# Patient Record
Sex: Female | Born: 1975 | Race: White | Hispanic: No | Marital: Married | Smoking: Never smoker
Health system: Southern US, Community
[De-identification: ages and names within clinical notes are randomized; demographics above are authoritative.]

## PROBLEM LIST (undated history)

## (undated) DIAGNOSIS — J302 Other seasonal allergic rhinitis: Secondary | ICD-10-CM

## (undated) DIAGNOSIS — N3010 Interstitial cystitis (chronic) without hematuria: Secondary | ICD-10-CM

---

## 2014-04-27 ENCOUNTER — Emergency Department
Admission: EM | Admit: 2014-04-27 | Discharge: 2014-04-27 | Disposition: A | Discharge status: Home / Self Care | Payer: BLUE CROSS/BLUE SHIELD | Source: Home / Self Care | Attending: Family Medicine | Admitting: Family Medicine

## 2014-04-27 ENCOUNTER — Encounter: Payer: Self-pay | Admitting: *Deleted

## 2014-04-27 DIAGNOSIS — J310 Chronic rhinitis: Secondary | ICD-10-CM

## 2014-04-27 HISTORY — DX: Interstitial cystitis (chronic) without hematuria: N30.10

## 2014-04-27 MED ORDER — PREDNISONE 20 MG PO TABS: 20.0000 mg | ORAL_TABLET | Freq: Two times a day (BID) | ORAL | Status: DC

## 2014-04-27 NOTE — Discharge Instructions (Signed)
If cold like symptoms develop, begin the following:  Take plain guaifenesin 1280m (Mucinex) twice daily, with plenty of water, for cough and congestion.  Get adequate rest.   May use Afrin nasal spray (or generic oxymetazoline) twice daily for about 5 days.  Also recommend using saline nasal spray several times daily and saline nasal irrigation (AYR is a common brand) Try warm salt water gargles for sore throat.  Stop all antihistamines for now, and other non-prescription cough/cold preparations. May take Delsym Cough Suppressant at bedtime for nighttime cough.   Follow-up with family doctor if not improving about10 days.

## 2014-04-27 NOTE — ED Notes (Signed)
Pt c/o muscle aches and congestion in her throat x 2 wks. Denies cough or fever.

## 2014-04-27 NOTE — ED Provider Notes (Signed)
CSN: [id number]     Arrival date & time 04/27/14  1944 History   First MD Initiated Contact with Patient 04/27/14 2000     Chief Complaint  Patient presents with  . Generalized Body Aches  . Nasal Congestion      HPI Comments: About three weeks ago patient developed typical cold-like symptoms including mild sore throat, sinus congestion, headache, and fatigue but no cough.  Her symptoms improved but she continues to have mild sinus congestion but no facial pain.  She has felt worse over the past few days with myalgias and increased sinus congestion. She notes that she has a history of seasonal rhinitis.   The history is provided by the patient.    Past Medical History  Diagnosis Date  . Interstitial cystitis    History reviewed. No pertinent past surgical history. Family History  Problem Relation Age of Onset  . Heart attack Mother   . Heart failure Father    History  Substance Use Topics  . Smoking status: Never Smoker   . Smokeless tobacco: Not on file  . Alcohol Use: No   OB History    No data available     Review of Systems + sore throat, resolved + hoarseness No cough No pleuritic pain No wheezing + nasal congestion + post-nasal drainage No sinus pain/pressure No itchy/red eyes ? earache No hemoptysis No SOB No fever/chills No nausea No vomiting No abdominal pain No diarrhea No urinary symptoms No skin rash + fatigue + myalgias + headache Used OTC meds without relief  Allergies  Augmentin  Home Medications   Prior to Admission medications   Medication Sig Start Date End Date Taking? Authorizing Provider  predniSONE (DELTASONE) 20 MG tablet Take 1 tablet (20 mg total) by mouth 2 (two) times daily. Take with food. 04/27/14   Kandra Nicolas, MD   BP 128/79 mmHg  Pulse 77  Temp(Src) 98.8 F (37.1 C)  Resp 16  Ht 5' 2"  (1.575 m)  Wt 151 lb (68.493 kg)  BMI 27.61 kg/m2  SpO2 100%  LMP 03/29/2014 Physical Exam Nursing notes and Vital Signs  reviewed. Appearance:  Patient appears stated age, and in no acute distress Eyes:  Pupils are equal, round, and reactive to light and accomodation.  Extraocular movement is intact.  Conjunctivae are not inflamed  Ears:  Canals normal.  Tympanic membranes normal.  Nose:  Congested turbinates.  No sinus tenderness.   Pharynx:  Normal Neck:  Supple.  Enlarged mildly tender posterior nodes are palpated bilaterally  Lungs:  Clear to auscultation.  Breath sounds are equal.  Chest:  Distinct tenderness to palpation over the mid-sternum.  Heart:  Regular rate and rhythm without murmurs, rubs, or gallops.  Abdomen:  Nontender without masses or hepatosplenomegaly.  Bowel sounds are present.  No CVA or flank tenderness.  Extremities:  No edema.  No calf tenderness Skin:  No rash present.   ED Course  Procedures none   MDM   1. Rhinitis, chronic; ?early viral URI    There is no evidence of bacterial infection today.   Begin prednisone burst. If cold like symptoms develop, begin the following:  Take plain guaifenesin 1245m (Mucinex) twice daily, with plenty of water, for cough and congestion.  Get adequate rest.   May use Afrin nasal spray (or generic oxymetazoline) twice daily for about 5 days.  Also recommend using saline nasal spray several times daily and saline nasal irrigation (AYR is a common brand) Try warm  salt water gargles for sore throat.  Stop all antihistamines for now, and other non-prescription cough/cold preparations. May take Delsym Cough Suppressant at bedtime for nighttime cough.   Follow-up with family doctor if not improving about10 days.    Kandra Nicolas, MD 04/29/14 [phone]

## 2014-04-30 ENCOUNTER — Telehealth: Payer: Self-pay | Admitting: Emergency Medicine

## 2014-12-15 ENCOUNTER — Emergency Department
Admission: EM | Admit: 2014-12-15 | Discharge: 2014-12-15 | Disposition: A | Discharge status: Home / Self Care | Payer: BLUE CROSS/BLUE SHIELD | Source: Home / Self Care | Attending: Family Medicine | Admitting: Family Medicine

## 2014-12-15 ENCOUNTER — Encounter: Payer: Self-pay | Admitting: *Deleted

## 2014-12-15 DIAGNOSIS — J069 Acute upper respiratory infection, unspecified: Secondary | ICD-10-CM

## 2014-12-15 DIAGNOSIS — R0981 Nasal congestion: Secondary | ICD-10-CM | POA: Diagnosis not present

## 2014-12-15 DIAGNOSIS — M542 Cervicalgia: Secondary | ICD-10-CM

## 2014-12-15 MED ORDER — AZITHROMYCIN 250 MG PO TABS: 250.0000 mg | ORAL_TABLET | Freq: Every day | ORAL | Status: DC

## 2014-12-15 MED ORDER — FLUTICASONE PROPIONATE 50 MCG/ACT NA SUSP: NASAL | Status: DC

## 2014-12-15 NOTE — ED Provider Notes (Signed)
CSN: [id number]     Arrival date & time 12/15/14  0807 History   First MD Initiated Contact with Patient 12/15/14 0813     Chief Complaint  Patient presents with  . Neck Pain  . Ear Fullness   (Consider location/radiation/quality/duration/timing/severity/associated sxs/prior Treatment) HPI  Pt is a 39yo female presenting to [facility] with c/o intermittent bilateral ear "fullness" nasal congestion, dizziness, headaches and chest tightness. Pt states she had a "cold" about 1 month ago but felt it was her "allergies" so she started taking Allegra. Her symptoms did improve but never completely went away.  States her children were recently dx with sinus infections and pt is concerned she may have one now.  Pt also c/o neck pain that woke her from her sleep around 4AM this morning. Pt states she believes she slept wrong and that's what caused the neck pain but isn't certain. Denies fever, chills, n/v/d. Denies CP or SOB at this time but states her chest does feel tight on occasions. Denies hx of asthma.  Past Medical History  Diagnosis Date  . Interstitial cystitis    History reviewed. No pertinent past surgical history. Family History  Problem Relation Age of Onset  . Heart attack Mother   . Heart disease Mother   . Heart failure Father   . Heart disease Father    Social History  Substance Use Topics  . Smoking status: Never Smoker   . Smokeless tobacco: None  . Alcohol Use: No   OB History    No data available     Review of Systems  Constitutional: Negative for fever and chills.  HENT: Positive for congestion, ear pain, sinus pressure, sneezing and sore throat. Negative for trouble swallowing and voice change.   Respiratory: Positive for cough and chest tightness. Negative for shortness of breath.   Gastrointestinal: Negative for nausea, vomiting, abdominal pain and diarrhea.  Musculoskeletal: Positive for myalgias, arthralgias and neck pain. Negative for neck stiffness.       Body aches   Neurological: Positive for dizziness and headaches. Negative for light-headedness.    Allergies  Augmentin  Home Medications   Prior to Admission medications   Medication Sig Start Date End Date Taking? Authorizing Provider  fexofenadine (ALLEGRA) 180 MG tablet Take 180 mg by mouth daily.   Yes Historical Provider, MD  azithromycin (ZITHROMAX) 250 MG tablet Take 1 tablet (250 mg total) by mouth daily. Take first 2 tablets together, then 1 every day until finished. 12/15/14   Noland Fordyce, PA-C  fluticasone (FLONASE) 50 MCG/ACT nasal spray 2 sprays per nostril daily first week, 1 spray per nostril daily 12/15/14   Noland Fordyce, PA-C  predniSONE (DELTASONE) 20 MG tablet Take 1 tablet (20 mg total) by mouth 2 (two) times daily. Take with food. 04/27/14   Kandra Nicolas, MD   Meds Ordered and Administered this Visit  Medications - No data to display  BP 125/85 mmHg  Pulse 71  Temp(Src) 98.6 F (37 C) (Oral)  Resp 16  Ht 5' 2"  (1.575 m)  Wt 154 lb (69.854 kg)  BMI 28.16 kg/m2  SpO2 99%  LMP 12/01/2014 No data found.   Physical Exam  Constitutional: She appears well-developed and well-nourished. No distress.  HENT:  Head: Normocephalic and atraumatic.  Right Ear: Hearing, tympanic membrane, external ear and ear canal normal.  Left Ear: Hearing, tympanic membrane, external ear and ear canal normal.  Nose: Mucosal edema present. Right sinus exhibits no maxillary sinus tenderness and no  frontal sinus tenderness. Left sinus exhibits no maxillary sinus tenderness and no frontal sinus tenderness.  Mouth/Throat: Uvula is midline and mucous membranes are normal. Posterior oropharyngeal erythema present. No oropharyngeal exudate, posterior oropharyngeal edema or tonsillar abscesses.  Eyes: Conjunctivae are normal. No scleral icterus.  Neck: Normal range of motion. Neck supple.    Tenderness at base of neck over C7 and surrounding muscles. FROM w/o pain.  Cardiovascular: Normal rate,  regular rhythm and normal heart sounds.   Pulmonary/Chest: Effort normal and breath sounds normal. No respiratory distress. She has no wheezes. She has no rales. She exhibits no tenderness.  Abdominal: Soft. She exhibits no distension. There is no tenderness.  Musculoskeletal: Normal range of motion. She exhibits tenderness. She exhibits no edema.  Tenderness to upper trapezius. FROM upper extremities with 5/5 strength bilaterally  Neurological: She is alert.  Skin: Skin is warm and dry. She is not diaphoretic.  Nursing note and vitals reviewed.   ED Course  Procedures (including critical care time)  Labs Review Labs Reviewed - No data to display  Imaging Review No results found.    MDM   1. Acute upper respiratory infection   2. Sinus congestion   3. Neck pain     URI symptoms intermittently for 4 weeks, worsening with ear fullness last 1 week. Sick contacts. Rx: azithromycin, flonase Advised pt to use acetaminophen and ibuprofen as needed for fever and pain. Encouraged rest and fluids. F/u with PCP in 10 days if not improving, sooner if worsening. Pt verbalized understanding and agreement with tx plan.    Noland Fordyce, PA-C 12/15/14 [phone]

## 2014-12-15 NOTE — ED Notes (Signed)
Pt c/o neck ache and bilateral ear stuffiness x 1 wk. Denies fever.

## 2014-12-15 NOTE — Discharge Instructions (Signed)
You may take 400-646m Ibuprofen (Motrin) every 6-8 hours for fever and pain  Alternate with Tylenol  You may take 5079mTylenol every 4-6 hours as needed for fever and pain  Follow-up with your primary care provider next week for recheck of symptoms if not improving.  Be sure to drink plenty of fluids and rest, at least 8hrs of sleep a night, preferably more while you are sick. Return urgent care or go to closest ER if you cannot keep down fluids/signs of dehydration, fever not reducing with Tylenol, difficulty breathing/wheezing, stiff neck, worsening condition, or other concerns (see below)

## 2014-12-31 ENCOUNTER — Encounter: Payer: Self-pay | Admitting: Emergency Medicine

## 2014-12-31 ENCOUNTER — Emergency Department
Admission: EM | Admit: 2014-12-31 | Discharge: 2014-12-31 | Disposition: A | Discharge status: Home / Self Care | Payer: BLUE CROSS/BLUE SHIELD | Source: Home / Self Care | Attending: Family Medicine | Admitting: Family Medicine

## 2014-12-31 DIAGNOSIS — R253 Fasciculation: Secondary | ICD-10-CM

## 2014-12-31 NOTE — ED Notes (Signed)
Face around Right eye started twitching and feeling numb 4 days ago, then moved to Left side, twitching and slight numbness. Patient states her mother died in her sleep and she has lots of anxiety and panic attacks.

## 2014-12-31 NOTE — Discharge Instructions (Signed)
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 In this condition, the eyes seem to be winking or even seem to be forced shut. There may be twitching of the eyelids that lasts for a long period of time.  BEB tends to occur while you are awake. The disorder may get worse, to the point that the eyes are closed for long periods of time. Although this interferes with vision, it is not a form of blindness, because the eyes themselves are not abnormal and are capable of seeing. You are simply unable to see through the closed lids. BEB seems to occur most commonly in middle aged and elderly women. BEB is different from occasional twitching of the eyelids, which happens to many people. Occasional twitching is called "benign fasciculation," and it has no lasting effect. It goes away within a few hours or days.  SYMPTOMS  BEB usually starts slowly. At first, you may:  Be blinking a lot.  Have a feeling of irritation in your eyes.  Notice your eyelids twitching a lot.  Be squinting more than usual; especially in bright light. As time goes on, you may have difficulty keeping your eyes open. In general, symptoms occur while you are awake and go away while you sleep. As time goes on, all of the symptoms seem to get worse and more intense. TREATMENT  Most commonly, BEB is treated with injections of botulinum toxin (Botox). Botox relaxes the muscles, stopping the twitching. Treatment may cause drooping eyelids, blurring of vision, dry eyes, or double vision, but these symptoms are usually temporary. Document Released: 03/17/2002 Document Revised: 06/19/2011 Document Reviewed: 02/04/2009 [employer] Patient Information 2015 [place], [place]. This information is not intended to replace advice given to you by your health care provider. Make sure you discuss any questions you have  with your health care provider.

## 2014-12-31 NOTE — ED Provider Notes (Signed)
CSN: [id number]     Arrival date & time 12/31/14  1710 History   First MD Initiated Contact with Patient 12/31/14 1740     Chief Complaint  Patient presents with  . Eye Problem      HPI Comments: Patient reports that she has had intermittent "twitching" in her right eye, associated with vague sense of numbness for 4 days.  The symptoms resolved in her right eye, and then occurred in her left eye.  No changes in vision.  No other neurologic symptoms.  She feels well otherwise.  The history is provided by the patient.    Past Medical History  Diagnosis Date  . Interstitial cystitis    History reviewed. No pertinent past surgical history. Family History  Problem Relation Age of Onset  . Heart attack Mother   . Heart disease Mother   . Heart failure Father   . Heart disease Father    Social History  Substance Use Topics  . Smoking status: Never Smoker   . Smokeless tobacco: None  . Alcohol Use: No   OB History    No data available     Review of Systems  Constitutional: Negative for fever, chills, diaphoresis, activity change, fatigue and unexpected weight change.  HENT: Positive for sore throat. Negative for congestion, ear pain, facial swelling, rhinorrhea and trouble swallowing.   Eyes: Negative for photophobia, pain, discharge, redness, itching and visual disturbance.  Respiratory: Negative.   Cardiovascular: Negative.   Gastrointestinal: Negative.   Endocrine: Negative.   Genitourinary: Negative.   Musculoskeletal: Negative.   Skin: Negative.   Neurological: Positive for numbness. Negative for dizziness, tremors, facial asymmetry, speech difficulty, weakness, light-headedness and headaches.  All other systems reviewed and are negative.   Allergies  Augmentin  Home Medications   Prior to Admission medications   Medication Sig Start Date End Date Taking? Authorizing Provider  azithromycin (ZITHROMAX) 250 MG tablet Take 1 tablet (250 mg total) by mouth daily. Take  first 2 tablets together, then 1 every day until finished. 12/15/14   Noland Fordyce, PA-C  fexofenadine (ALLEGRA) 180 MG tablet Take 180 mg by mouth daily.    Historical Provider, MD  fluticasone (FLONASE) 50 MCG/ACT nasal spray 2 sprays per nostril daily first week, 1 spray per nostril daily 12/15/14   Noland Fordyce, PA-C  predniSONE (DELTASONE) 20 MG tablet Take 1 tablet (20 mg total) by mouth 2 (two) times daily. Take with food. 04/27/14   Kandra Nicolas, MD   Meds Ordered and Administered this Visit  Medications - No data to display  BP 135/86 mmHg  Pulse 71  Temp(Src) 98.8 F (37.1 C) (Oral)  Ht 5' 2"  (1.575 m)  Wt 155 lb (70.308 kg)  BMI 28.34 kg/m2  SpO2 100%  LMP 12/01/2014 No data found.   Physical Exam Nursing notes and Vital Signs reviewed. Appearance:  Patient appears stated age, and in no acute distress Eyes:  Pupils are equal, round, and reactive to light and accomodation.  Extraocular movement is intact.  Conjunctivae are not inflamed.  Fundi benign.  No photophobia.  No eyelid tenderness or swelling.  No tics noted.  Ears:  Canals normal.  Tympanic membranes normal.  Nose:   Normal turbinates.  No sinus tenderness.   Pharynx:  Normal Neck:  Supple.  No adenopathy or thyromegaly Lungs:  Clear to auscultation.  Breath sounds are equal.  Moving air well. Heart:  Regular rate and rhythm without murmurs, rubs, or gallops.  Abdomen:  Nontender without masses or hepatosplenomegaly.  Bowel sounds are present.  No CVA or flank tenderness.  Extremities:  No edema.    Skin:  No rash present.   Neurologic:  Cranial nerves 2 through 12 are normal.  Patellar and elbow reflexes are normal.  Cerebellar function is intact (finger-to-nose and rapid alternating hand movement).  Gait and station are normal.     ED Course  Procedures  None   Visual Acuity Review  Right Eye Distance: 20/25 Left Eye Distance: 20/25 Bilateral Distance: 20/25 (with correction)    MDM   1. Benign  fasciculations eyelids; note normal exam   Reassurance.  Followup with ophthalmologist if symptoms do not resolve spontaneously.      Kandra Nicolas, MD 01/04/15 1021

## 2015-01-21 ENCOUNTER — Encounter: Payer: Self-pay | Admitting: Emergency Medicine

## 2015-01-21 ENCOUNTER — Emergency Department
Admission: EM | Admit: 2015-01-21 | Discharge: 2015-01-21 | Disposition: A | Discharge status: Home / Self Care | Payer: BLUE CROSS/BLUE SHIELD | Source: Home / Self Care | Attending: Family Medicine | Admitting: Family Medicine

## 2015-01-21 DIAGNOSIS — H9201 Otalgia, right ear: Secondary | ICD-10-CM

## 2015-01-21 NOTE — Discharge Instructions (Signed)
You may start using your Allegra, Flonase, and try sinus rinses to see if this helps alleviate pain and pressure in your Right ear.  Please follow up with your primary care provider for recheck of symptoms in 1 week if not improving, sooner if worsening.

## 2015-01-21 NOTE — ED Notes (Signed)
Right earache started yesterday

## 2015-01-21 NOTE — ED Provider Notes (Signed)
CSN: [id number]     Arrival date & time 01/21/15  0807 History   First MD Initiated Contact with Patient 01/21/15 0818     Chief Complaint  Patient presents with  . Otalgia   (Consider location/radiation/quality/duration/timing/severity/associated sxs/prior Treatment) HPI  Pt is a 39yo female presenting to [facility] with c/o gradually worsening Right ear pain that is aching and throbbing.  Symptoms started yesterday.  Pain is 8/10 at worst. She denies change in hearing but states it does feel "full."  She recently saw an allergist who recommended she start [place] but has been contemplating the allergy shots so she has not started her allergy medication yet. She reports mild nasal congestion but denies cough, sore throat, fever, chills, n/v/d. Denies sick contacts or recent travel.   Past Medical History  Diagnosis Date  . Interstitial cystitis    History reviewed. No pertinent past surgical history. Family History  Problem Relation Age of Onset  . Heart attack Mother   . Heart disease Mother   . Heart failure Father   . Heart disease Father    Social History  Substance Use Topics  . Smoking status: Never Smoker   . Smokeless tobacco: None  . Alcohol Use: No   OB History    No data available     Review of Systems  Constitutional: Negative for fever and chills.  HENT: Positive for congestion and ear pain ( Right). Negative for ear discharge, hearing loss, sinus pressure and sore throat.   Respiratory: Negative for cough and shortness of breath.   Gastrointestinal: Negative for nausea, vomiting, abdominal pain and diarrhea.    Allergies  Augmentin  Home Medications   Prior to Admission medications   Medication Sig Start Date End Date Taking? Authorizing Provider  azithromycin (ZITHROMAX) 250 MG tablet Take 1 tablet (250 mg total) by mouth daily. Take first 2 tablets together, then 1 every day until finished. 12/15/14   Noland Fordyce, PA-C  fexofenadine (ALLEGRA) 180  MG tablet Take 180 mg by mouth daily.    Historical Provider, MD  fluticasone (FLONASE) 50 MCG/ACT nasal spray 2 sprays per nostril daily first week, 1 spray per nostril daily 12/15/14   Noland Fordyce, PA-C  predniSONE (DELTASONE) 20 MG tablet Take 1 tablet (20 mg total) by mouth 2 (two) times daily. Take with food. 04/27/14   Kandra Nicolas, MD   Meds Ordered and Administered this Visit  Medications - No data to display  BP 125/85 mmHg  Pulse 74  Temp(Src) 98.6 F (37 C) (Oral)  Ht 5' 2"  (1.575 m)  Wt 154 lb (69.854 kg)  BMI 28.16 kg/m2  SpO2 97% No data found.   Physical Exam  Constitutional: She appears well-developed and well-nourished. No distress.  HENT:  Head: Normocephalic and atraumatic.  Right Ear: Hearing, external ear and ear canal normal. Tympanic membrane is scarred.  Left Ear: Hearing, tympanic membrane, external ear and ear canal normal.  Nose: Nose normal.  Mouth/Throat: Uvula is midline, oropharynx is clear and moist and mucous membranes are normal.  Eyes: Conjunctivae are normal. No scleral icterus.  Neck: Normal range of motion. Neck supple.  Cardiovascular: Normal rate, regular rhythm and normal heart sounds.   Pulmonary/Chest: Effort normal and breath sounds normal. No respiratory distress. She has no wheezes. She has no rales. She exhibits no tenderness.  Musculoskeletal: Normal range of motion.  Lymphadenopathy:    She has no cervical adenopathy.  Neurological: She is alert.  Skin: Skin is  warm and dry. She is not diaphoretic.  Nursing note and vitals reviewed.   ED Course  Procedures (including critical care time)  Labs Review Labs Reviewed - No data to display  Imaging Review No results found.  Tympanometry: Left and Right ear: normal   MDM   1. Right ear pain     Pt c/o Right ear pain and pressure. Mild nasal congestion. Pt appears well, non-toxic, and is afebrile. Right TM, mildly scarred but otherwise unremarkable. Normal  tympanometry Reassured pt. Encouraged her to start using her allergy medication and sinus rinses.  F/u with PCP for recheck of symptoms in 1 week, sooner if worsening. Patient verbalized understanding and agreement with treatment plan.    Noland Fordyce, PA-C 01/21/15 [phone]

## 2015-01-24 ENCOUNTER — Telehealth: Payer: Self-pay | Admitting: Emergency Medicine

## 2015-03-20 ENCOUNTER — Emergency Department
Admission: EM | Admit: 2015-03-20 | Discharge: 2015-03-20 | Disposition: A | Discharge status: Home / Self Care | Payer: BLUE CROSS/BLUE SHIELD | Source: Home / Self Care | Attending: Family Medicine | Admitting: Family Medicine

## 2015-03-20 ENCOUNTER — Emergency Department (INDEPENDENT_AMBULATORY_CARE_PROVIDER_SITE_OTHER): Payer: BLUE CROSS/BLUE SHIELD

## 2015-03-20 ENCOUNTER — Encounter: Payer: Self-pay | Admitting: Emergency Medicine

## 2015-03-20 DIAGNOSIS — R053 Chronic cough: Secondary | ICD-10-CM

## 2015-03-20 DIAGNOSIS — J3089 Other allergic rhinitis: Secondary | ICD-10-CM

## 2015-03-20 DIAGNOSIS — R05 Cough: Secondary | ICD-10-CM

## 2015-03-20 DIAGNOSIS — J309 Allergic rhinitis, unspecified: Secondary | ICD-10-CM | POA: Diagnosis not present

## 2015-03-20 LAB — POCT CBC W AUTO DIFF (K'VILLE URGENT CARE)

## 2015-03-20 MED ORDER — PREDNISONE 20 MG PO TABS: 20.0000 mg | ORAL_TABLET | Freq: Two times a day (BID) | ORAL | Status: DC

## 2015-03-20 MED ORDER — FLUTICASONE PROPIONATE 50 MCG/ACT NA SUSP: NASAL | Status: DC

## 2015-03-20 MED ORDER — MONTELUKAST SODIUM 10 MG PO TABS
10.0000 mg | ORAL_TABLET | Freq: Every day | ORAL | Status: DC
Start: 2015-03-20 — End: 2017-11-18

## 2015-03-20 NOTE — ED Notes (Signed)
Reports respiratory illness for over a month; treated on Levofloxin 11/18-11/28 for bronchitis which improved but now has similar symptoms.

## 2015-03-20 NOTE — ED Provider Notes (Signed)
CSN: [id number]     Arrival date & time 03/20/15  0915 History   First MD Initiated Contact with Patient 03/20/15 [phone]     Chief Complaint  Patient presents with  . Cough  . Nasal Congestion      HPI Comments: Patient developed a cough and cold-like illness about a month ago, and finished a course of Levaquin about 10 days ago.  She felt improved somewhat, but continues to have a productive cough with intermittent wheezing.  She has been fatigued, and has had chills but no fever.  No pleuritic pain.  She has a history of perennial rhinitis, and plans to start allergy immunotherapy in January 2017. She denies history of asthma or family history of asthma.  The history is provided by the patient.    Past Medical History  Diagnosis Date  . Interstitial cystitis    History reviewed. No pertinent past surgical history. Family History  Problem Relation Age of Onset  . Heart attack Mother   . Heart disease Mother   . Heart failure Father   . Heart disease Father    Social History  Substance Use Topics  . Smoking status: Never Smoker   . Smokeless tobacco: None  . Alcohol Use: No   OB History    No data available     Review of Systems + sore throat + cough + sneezing No pleuritic pain + wheezing + nasal congestion + post-nasal drainage No sinus pain/pressure No itchy/red eyes ? earache No hemoptysis No SOB No fever, + chills No nausea No vomiting No abdominal pain No diarrhea No urinary symptoms No skin rash + fatigue No myalgias + headache Used OTC meds without relief  Allergies  Augmentin  Home Medications   Prior to Admission medications   Medication Sig Start Date End Date Taking? Authorizing Provider  fexofenadine (ALLEGRA) 180 MG tablet Take 180 mg by mouth daily.    Historical Provider, MD  fluticasone (FLONASE) 50 MCG/ACT nasal spray 2 sprays per nostril daily first week, then 1 spray per nostril daily 03/20/15   Kandra Nicolas, MD  montelukast  (SINGULAIR) 10 MG tablet Take 1 tablet (10 mg total) by mouth at bedtime. 03/20/15   Kandra Nicolas, MD  predniSONE (DELTASONE) 20 MG tablet Take 1 tablet (20 mg total) by mouth 2 (two) times daily. Take with food. 03/20/15   Kandra Nicolas, MD   Meds Ordered and Administered this Visit  Medications - No data to display  BP 110/74 mmHg  Pulse 83  Temp(Src) 98.1 F (36.7 C) (Oral)  Resp 16  Ht 5' 2"  (1.575 m)  Wt 140 lb (63.504 kg)  BMI 25.60 kg/m2  SpO2 99%  LMP 03/06/2015 No data found.   Physical Exam Nursing notes and Vital Signs reviewed. Appearance:  Patient appears stated age, and in no acute distress Eyes:  Pupils are equal, round, and reactive to light and accomodation.  Extraocular movement is intact.  Conjunctivae are not inflamed  Ears:  Canals normal.  Tympanic membranes normal.  Nose:  Congested turbinates.  No sinus tenderness.  Pharynx:  Normal Neck:  Supple.  Tender shotty posterior nodes are palpated bilaterally  Lungs:  Faint scattered wheezes bilaterally.  No rales or rhonchi.  Breath sounds are equal.  Moving air well. Heart:  Regular rate and rhythm without murmurs, rubs, or gallops.  Abdomen:  Nontender without masses or hepatosplenomegaly.  Bowel sounds are present.  No CVA or flank tenderness.  Extremities:  No edema.  No calf tenderness Skin:  No rash present.   ED Course  Procedures  None    Labs Reviewed  POCT CBC W AUTO DIFF ([facility]):  WBC 7.9; LY 35.6; MO 9.7; GR 54.7; Hgb 14.4; Platelets 242     Imaging Review Dg Chest 2 View  03/20/2015  CLINICAL DATA:  One month history of productive cough EXAM: CHEST  2 VIEW COMPARISON:  None. FINDINGS: Lungs are clear. Heart size and pulmonary vascularity are normal. No adenopathy. No bone lesions. IMPRESSION: No edema or consolidation. Electronically Signed   By: Lowella Grip III M.D.   On: 03/20/2015 09:56     MDM   1. Perennial allergic rhinitis   2. Persistent cough, suspect  allergy mediated   Prednisone burst.  Begin Flonase Begin trial of Singulair. Followup with allergist as scheduled    Kandra Nicolas, MD 03/26/15 1141

## 2015-03-20 NOTE — Discharge Instructions (Signed)
Allergic Rhinitis Allergic rhinitis is when the mucous membranes in the nose respond to allergens. Allergens are particles in the air that cause your body to have an allergic reaction. This causes you to release allergic antibodies. Through a chain of events, these eventually cause you to release histamine into the blood stream. Although meant to protect the body, it is this release of histamine that causes your discomfort, such as frequent sneezing, congestion, and an itchy, runny nose.  CAUSES Seasonal allergic rhinitis (hay fever) is caused by pollen allergens that may come from grasses, trees, and weeds. Year-round allergic rhinitis (perennial allergic rhinitis) is caused by allergens such as house dust mites, pet dander, and mold spores. SYMPTOMS  Nasal stuffiness (congestion).  Itchy, runny nose with sneezing and tearing of the eyes. DIAGNOSIS Your health care provider can help you determine the allergen or allergens that trigger your symptoms. If you and your health care provider are unable to determine the allergen, skin or blood testing may be used. Your health care provider will diagnose your condition after taking your health history and performing a physical exam. Your health care provider may assess you for other related conditions, such as asthma, pink eye, or an ear infection. TREATMENT Allergic rhinitis does not have a cure, but it can be controlled by:  Medicines that block allergy symptoms. These may include allergy shots, nasal sprays, and oral antihistamines.  Avoiding the allergen. Hay fever may often be treated with antihistamines in pill or nasal spray forms. Antihistamines block the effects of histamine. There are over-the-counter medicines that may help with nasal congestion and swelling around the eyes. Check with your health care provider before taking or giving this medicine. If avoiding the allergen or the medicine prescribed do not work, there are many new medicines  your health care provider can prescribe. Stronger medicine may be used if initial measures are ineffective. Desensitizing injections can be used if medicine and avoidance does not work. Desensitization is when a patient is given ongoing shots until the body becomes less sensitive to the allergen. Make sure you follow up with your health care provider if problems continue. HOME CARE INSTRUCTIONS It is not possible to completely avoid allergens, but you can reduce your symptoms by taking steps to limit your exposure to them. It helps to know exactly what you are allergic to so that you can avoid your specific triggers. SEEK MEDICAL CARE IF:  You have a fever.  You develop a cough that does not stop easily (persistent).  You have shortness of breath.  You start wheezing.  Symptoms interfere with normal daily activities.   This information is not intended to replace advice given to you by your health care provider. Make sure you discuss any questions you have with your health care provider.   Document Released: 12/20/2000 Document Revised: 04/17/2014 Document Reviewed: 12/02/2012 Elsevier Interactive Patient Education [employer].

## 2015-05-21 ENCOUNTER — Emergency Department
Admission: EM | Admit: 2015-05-21 | Discharge: 2015-05-21 | Disposition: A | Discharge status: Home / Self Care | Payer: BLUE CROSS/BLUE SHIELD | Source: Home / Self Care | Attending: Family Medicine | Admitting: Family Medicine

## 2015-05-21 DIAGNOSIS — R6884 Jaw pain: Secondary | ICD-10-CM

## 2015-05-21 DIAGNOSIS — J029 Acute pharyngitis, unspecified: Secondary | ICD-10-CM

## 2015-05-21 LAB — POCT RAPID STREP A (OFFICE): Rapid Strep A Screen: NEGATIVE

## 2015-05-21 NOTE — ED Provider Notes (Signed)
CSN: [id number]     Arrival date & time 05/21/15  1936 History   First MD Initiated Contact with Patient 05/21/15 2013     Chief Complaint  Patient presents with  . Jaw Pain   (Consider location/radiation/quality/duration/timing/severity/associated sxs/prior Treatment) HPI  Pt is a 40yo female presenting to [facility] with c/o bilateral jaw pain that started about 1 week ago with associated mouth tingling and soreness.  She started getting allergy shots about 1 month ago and is concerned symptoms are related to the shots but her allergist and PCP advised her that is an unlikely side effect. She did call her dentist today but they were not open and unsure if they check their messages on Fridays.  She took ibuprofen to have dinner tonight, which provided moderate relief. She also notes she has been using Flonase recently and wonders if that is causing the tingling or jaw pain. Denies difficulty breathing or swallowing but she does have mild sore throat that is aching. Children at home have been sick recently but no dx of strep. She denies fever, chills, n/v/d. She does not believe she grinds her teeth at night as she wears a retainer to prevent that and also believes she is a mouth breather but does note jaw pain started the other day as she felt stressed cleaning her daughter's room.    Past Medical History  Diagnosis Date  . Interstitial cystitis    History reviewed. No pertinent past surgical history. Family History  Problem Relation Age of Onset  . Heart attack Mother   . Heart disease Mother   . Heart failure Father   . Heart disease Father    Social History  Substance Use Topics  . Smoking status: Never Smoker   . Smokeless tobacco: None  . Alcohol Use: No   OB History    No data available     Review of Systems  Constitutional: Negative for fever and chills.  HENT: Positive for dental problem ( jaw pain) and sore throat. Negative for congestion, ear pain, facial swelling, trouble  swallowing and voice change.   Respiratory: Negative for cough and shortness of breath.   Cardiovascular: Negative for chest pain and palpitations.  Gastrointestinal: Negative for nausea, vomiting, abdominal pain and diarrhea.  Musculoskeletal: Negative for myalgias, back pain and arthralgias.  Skin: Negative for rash.    Allergies  Augmentin  Home Medications   Prior to Admission medications   Medication Sig Start Date End Date Taking? Authorizing Provider  fexofenadine (ALLEGRA) 180 MG tablet Take 180 mg by mouth daily.    Historical Provider, MD  fluticasone (FLONASE) 50 MCG/ACT nasal spray 2 sprays per nostril daily first week, then 1 spray per nostril daily 03/20/15   Kandra Nicolas, MD  montelukast (SINGULAIR) 10 MG tablet Take 1 tablet (10 mg total) by mouth at bedtime. 03/20/15   Kandra Nicolas, MD  predniSONE (DELTASONE) 20 MG tablet Take 1 tablet (20 mg total) by mouth 2 (two) times daily. Take with food. 03/20/15   Kandra Nicolas, MD   Meds Ordered and Administered this Visit  Medications - No data to display  BP 133/86 mmHg  Pulse 72  Temp(Src) 98 F (36.7 C) (Oral)  Ht 5' 2"  (1.575 m)  Wt 145 lb (65.772 kg)  BMI 26.51 kg/m2  SpO2 100%  LMP 05/14/2015 (Approximate) No data found.   Physical Exam  Constitutional: She is oriented to person, place, and time. She appears well-developed and well-nourished.  HENT:  Head: Normocephalic and atraumatic.  Right Ear: Hearing, external ear and ear canal normal. Tympanic membrane is scarred.  Left Ear: Hearing, tympanic membrane, external ear and ear canal normal.  Nose: Nose normal.  Mouth/Throat: Uvula is midline and mucous membranes are normal. No trismus in the jaw. No dental abscesses or uvula swelling. Posterior oropharyngeal erythema present. No oropharyngeal exudate, posterior oropharyngeal edema or tonsillar abscesses.  Mild tenderness over bilateral TMJs but no crepitus. No tenderness to temporal arteries.     Eyes: EOM are normal.  Neck: Normal range of motion. Neck supple. No JVD present. No tracheal deviation present. No thyromegaly present.  Cardiovascular: Normal rate, regular rhythm and normal heart sounds.   Pulmonary/Chest: Effort normal and breath sounds normal. No stridor. No respiratory distress. She has no wheezes. She has no rales. She exhibits no tenderness.  Musculoskeletal: Normal range of motion.  Lymphadenopathy:    She has no cervical adenopathy.  Neurological: She is alert and oriented to person, place, and time.  Skin: Skin is warm and dry.  Psychiatric: She has a normal mood and affect. Her behavior is normal.  Nursing note and vitals reviewed.   ED Course  Procedures (including critical care time)  Labs Review Labs Reviewed  STREP A DNA PROBE  POCT RAPID STREP A (OFFICE)    Imaging Review No results found.     MDM   1. Pain in bone of jaw   2. Sore throat     Pt c/o bilateral jaw pain, sore throat and mouth tingling for over 1 week. No evidence of anaphylaxis.  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  Encouraged to try 611m Ibuprofen every 6-8 hours for pain and swelling. Encouraged to f/u with her dentist next week for further evaluation. Patient verbalized understanding and agreement with treatment plan.     ENoland Fordyce PA-C 05/21/15 2038

## 2015-05-21 NOTE — ED Notes (Signed)
Started allergy shot a month ago, had one yesterday.  Having weird feelings in mouth, tingling, soreness, also sore throat.

## 2015-05-21 NOTE — Discharge Instructions (Signed)
You may take 400-630m Ibuprofen (Motrin) every 6-8 hours for pain  Alternate with Tylenol  You may take 509mTylenol every 4-6 hours as needed for pain

## 2015-05-22 LAB — STREP A DNA PROBE: GASP: NOT DETECTED

## 2015-05-23 ENCOUNTER — Telehealth: Payer: Self-pay | Admitting: Emergency Medicine

## 2015-06-10 ENCOUNTER — Encounter: Payer: Self-pay | Admitting: Emergency Medicine

## 2015-06-10 ENCOUNTER — Emergency Department
Admission: EM | Admit: 2015-06-10 | Discharge: 2015-06-10 | Disposition: A | Discharge status: Home / Self Care | Payer: BLUE CROSS/BLUE SHIELD | Source: Home / Self Care | Attending: Family Medicine | Admitting: Family Medicine

## 2015-06-10 DIAGNOSIS — J029 Acute pharyngitis, unspecified: Secondary | ICD-10-CM | POA: Diagnosis not present

## 2015-06-10 DIAGNOSIS — Z20818 Contact with and (suspected) exposure to other bacterial communicable diseases: Secondary | ICD-10-CM

## 2015-06-10 DIAGNOSIS — Z2089 Contact with and (suspected) exposure to other communicable diseases: Secondary | ICD-10-CM

## 2015-06-10 LAB — POCT RAPID STREP A (OFFICE): Rapid Strep A Screen: NEGATIVE

## 2015-06-10 NOTE — ED Provider Notes (Signed)
CSN: [id number]     Arrival date & time 06/10/15  1807 History   None    Chief Complaint  Patient presents with  . Sore Throat   (Consider location/radiation/quality/duration/timing/severity/associated sxs/prior Treatment) HPI The pt is a 40yo female presenting to [facility] with c/o sore throat that started about 4 days ago.  Pain is a burning sensation 2/10 at worst. Worse with swallowing. She states her daughter recently tested positive for strep throat and just wants to make sure she does not have strep. She is currently undergoing allergy shots and states she thinks the throat pain is due to allergies but just wants to make sure.  She also reports being on Doxycycline prescribed by her dermatologist but knows doxycycline does not always cover strep. Denies fever, chills, n/v/d. Denies cough.   Past Medical History  Diagnosis Date  . Interstitial cystitis    History reviewed. No pertinent past surgical history. Family History  Problem Relation Age of Onset  . Heart attack Mother   . Heart disease Mother   . Heart failure Father   . Heart disease Father    Social History  Substance Use Topics  . Smoking status: Never Smoker   . Smokeless tobacco: None  . Alcohol Use: No   OB History    No data available     Review of Systems  Constitutional: Negative for fever and chills.  HENT: Positive for ear pain and sore throat. Negative for congestion, trouble swallowing and voice change.   Respiratory: Negative for cough and shortness of breath.   Cardiovascular: Negative for chest pain and palpitations.  Gastrointestinal: Negative for nausea, vomiting, abdominal pain and diarrhea.  Musculoskeletal: Positive for myalgias and arthralgias. Negative for back pain.  Skin: Negative for rash.  Neurological: Positive for headaches. Negative for dizziness and light-headedness.    Allergies  Augmentin  Home Medications   Prior to Admission medications   Medication Sig Start Date End Date  Taking? Authorizing Provider  fexofenadine (ALLEGRA) 180 MG tablet Take 180 mg by mouth daily.    Historical Provider, MD  fluticasone (FLONASE) 50 MCG/ACT nasal spray 2 sprays per nostril daily first week, then 1 spray per nostril daily 03/20/15   Kandra Nicolas, MD  montelukast (SINGULAIR) 10 MG tablet Take 1 tablet (10 mg total) by mouth at bedtime. 03/20/15   Kandra Nicolas, MD  predniSONE (DELTASONE) 20 MG tablet Take 1 tablet (20 mg total) by mouth 2 (two) times daily. Take with food. 03/20/15   Kandra Nicolas, MD   Meds Ordered and Administered this Visit  Medications - No data to display  BP 129/86 mmHg  Pulse 77  Temp(Src) 98.4 F (36.9 C) (Oral)  Ht 5' 2"  (1.575 m)  Wt 145 lb (65.772 kg)  BMI 26.51 kg/m2  SpO2 99%  LMP 05/14/2015 (Approximate) No data found.   Physical Exam  Constitutional: She appears well-developed and well-nourished. No distress.  HENT:  Head: Normocephalic and atraumatic.  Right Ear: Tympanic membrane normal.  Left Ear: Tympanic membrane normal.  Nose: Rhinorrhea present.  Mouth/Throat: Uvula is midline and mucous membranes are normal. Posterior oropharyngeal erythema present. No oropharyngeal exudate, posterior oropharyngeal edema or tonsillar abscesses.  Eyes: Conjunctivae are normal. No scleral icterus.  Neck: Normal range of motion. Neck supple.  Cardiovascular: Normal rate, regular rhythm and normal heart sounds.   Pulmonary/Chest: Effort normal and breath sounds normal. No stridor. No respiratory distress. She has no wheezes. She has no rales. She exhibits no  tenderness.  Abdominal: Soft. She exhibits no distension. There is no tenderness.  Musculoskeletal: Normal range of motion.  Lymphadenopathy:    She has no cervical adenopathy.  Neurological: She is alert.  Skin: Skin is warm and dry. She is not diaphoretic.  Nursing note and vitals reviewed.   ED Course  Procedures (including critical care time)  Labs Review Labs Reviewed   STREP A DNA PROBE  POCT RAPID STREP A (OFFICE)    Imaging Review No results found.   MDM   1. Sore throat   2. Exposure to strep throat    Pt c/o sore throat for 4 days, exposure to strep throat at home. No evidence of peritonsillar abscess.  Rapid strep: negative  Advised pt to use acetaminophen and ibuprofen as needed for fever and pain. Encouraged rest and fluids. F/u with PCP in 7-10 days if not improving, sooner if worsening. Pt verbalized understanding and agreement with tx plan.     Noland Fordyce, PA-C 06/10/15 1839

## 2015-06-10 NOTE — Discharge Instructions (Signed)
You may take 400-667m Ibuprofen (Motrin) every 6-8 hours for fever and pain  Alternate with Tylenol  You may take 5063mTylenol every 4-6 hours as needed for fever and pain  Follow-up with your primary care provider next week for recheck of symptoms if not improving.  Be sure to drink plenty of fluids and rest, at least 8hrs of sleep a night, preferably more while you are sick. Return urgent care or go to closest ER if you cannot keep down fluids/signs of dehydration, fever not reducing with Tylenol, difficulty breathing/wheezing, stiff neck, worsening condition, or other concerns (see below)

## 2015-06-10 NOTE — ED Notes (Signed)
Sore throat x 4 days

## 2015-06-11 ENCOUNTER — Telehealth: Payer: Self-pay | Admitting: *Deleted

## 2015-06-11 LAB — STREP A DNA PROBE: GASP: NOT DETECTED

## 2015-11-03 ENCOUNTER — Emergency Department
Admission: EM | Admit: 2015-11-03 | Discharge: 2015-11-03 | Disposition: A | Discharge status: Home / Self Care | Payer: BLUE CROSS/BLUE SHIELD | Source: Home / Self Care | Attending: Family Medicine | Admitting: Family Medicine

## 2015-11-03 ENCOUNTER — Encounter: Payer: Self-pay | Admitting: *Deleted

## 2015-11-03 DIAGNOSIS — S30861A Insect bite (nonvenomous) of abdominal wall, initial encounter: Secondary | ICD-10-CM | POA: Diagnosis not present

## 2015-11-03 DIAGNOSIS — W57XXXA Bitten or stung by nonvenomous insect and other nonvenomous arthropods, initial encounter: Secondary | ICD-10-CM

## 2015-11-03 MED ORDER — DOXYCYCLINE HYCLATE 100 MG PO CAPS: ORAL_CAPSULE | ORAL | 0 refills | Status: DC

## 2015-11-03 NOTE — ED Triage Notes (Signed)
Pt reports removing a tick from her RT flank area this afternoon. She is unsure if she removed the entire tick.

## 2015-11-03 NOTE — ED Provider Notes (Signed)
[facility]    CSN: [id number] Arrival date & time: 11/03/15  1754  First Provider Contact:  First MD Initiated Contact with Patient 11/03/15 1810        History   Chief Complaint Chief Complaint  Patient presents with  . Tick Removal    HPI Shanessa Hodak is a 40 y.o. female.   Patient was mowing grass about 3 hours ago.  Later she noticed a tiny embedded tick on her right flank and removed it.  She wonders if tick mouth parts were removed.   The history is provided by the patient.    Past Medical History:  Diagnosis Date  . Interstitial cystitis     There are no active problems to display for this patient.   History reviewed. No pertinent surgical history.  OB History    No data available       Home Medications    Prior to Admission medications   Medication Sig Start Date End Date Taking? Authorizing Provider  UNKNOWN TO PATIENT    Yes Historical Provider, MD  doxycycline (VIBRAMYCIN) 100 MG capsule Take two tabs by mouth as a single dose. Take with food. 11/03/15   Kandra Nicolas, MD  fexofenadine (ALLEGRA) 180 MG tablet Take 180 mg by mouth daily.    Historical Provider, MD  fluticasone (FLONASE) 50 MCG/ACT nasal spray 2 sprays per nostril daily first week, then 1 spray per nostril daily 03/20/15   Kandra Nicolas, MD  montelukast (SINGULAIR) 10 MG tablet Take 1 tablet (10 mg total) by mouth at bedtime. 03/20/15   Kandra Nicolas, MD    Family History Family History  Problem Relation Age of Onset  . Heart attack Mother   . Heart disease Mother   . Heart failure Father   . Heart disease Father     Social History Social History  Substance Use Topics  . Smoking status: Never Smoker  . Smokeless tobacco: Never Used  . Alcohol use No     Allergies   Augmentin [amoxicillin-pot clavulanate]   Review of Systems Review of Systems  Constitutional: Negative for chills, fatigue and fever.  HENT: Negative.   Eyes: Negative.     Respiratory: Negative.   Cardiovascular: Negative.   Gastrointestinal: Negative.   Genitourinary: Negative.   Musculoskeletal: Negative.   Skin:       Tick bite right flank  Neurological: Negative for headaches.     Physical Exam Triage Vital Signs ED Triage Vitals [11/03/15 1809]  Enc Vitals Group     BP 138/86     Pulse Rate 80     Resp 16     Temp 98.8 F (37.1 C)     Temp Source Oral     SpO2 99 %     Weight 140 lb (63.5 kg)     Height 5' 2"  (1.575 m)     Head Circumference      Peak Flow      Pain Score      Pain Loc      Pain Edu?      Excl. in [place]?    No data found.   Updated Vital Signs BP 138/86 (BP Location: Left Arm)   Pulse 80   Temp 98.8 F (37.1 C) (Oral)   Resp 16   Ht 5' 2"  (1.575 m)   Wt 140 lb (63.5 kg)   LMP 10/13/2015   SpO2 99%   BMI 25.61 kg/m   Visual Acuity  Right Eye Distance:   Left Eye Distance:   Bilateral Distance:    Right Eye Near:   Left Eye Near:    Bilateral Near:     Physical Exam  Constitutional: She is oriented to person, place, and time. She appears well-developed and well-nourished. No distress.  HENT:  Head: Normocephalic.  Mouth/Throat: Oropharynx is clear and moist.  Eyes: EOM are normal. Pupils are equal, round, and reactive to light.  Neck: Normal range of motion.  Pulmonary/Chest: No respiratory distress.  Abdominal: There is no tenderness.  Musculoskeletal:       Back:  Right flank reveals 58m diameter erythema with tiny tick mouth parts visible in center.  Neurological: She is alert and oriented to person, place, and time.  Skin: Skin is warm and dry.  Nursing note and vitals reviewed.    UC Treatments / Results  Labs (all labs ordered are listed, but only abnormal results are displayed) Labs Reviewed - No data to display  EKG  EKG Interpretation None       Radiology No results found.  Procedures Procedures Tick mouth part removal:   Discussed benefits and risks of procedure and  verbal consent obtained. Using sterile technique and local anesthesia with 1% lidocaine with epinephrine, cleansed wound with Betadine followed by alcohol. Easily debrided tick mouth parts with #11 blade.  Bacitracin and non-stick sterile dressing applied.  Wound precautions explained to patient.     Medications Ordered in UC Medications - No data to display   Initial Impression / Assessment and Plan / UC Course  I have reviewed the triage vital signs and the nursing notes.  Pertinent labs & imaging results that were available during my care of the patient were reviewed by me and considered in my medical decision making (see chart for details).  Clinical Course       Final Clinical Impressions(s) / UC Diagnoses   Final diagnoses:  Tick bite of right flank, initial encounter    Will begin tick bite prophylaxis with doxycycline 2039mReturn for any signs infection:  Redness, swelling, pain, etc.   New Prescriptions Discharge Medication List as of 11/03/2015  6:32 PM    START taking these medications   Details  doxycycline (VIBRAMYCIN) 100 MG capsule Take two tabs by mouth as a single dose. Take with food., Print         StKandra NicolasMD 11/03/15 [id number]

## 2015-11-03 NOTE — Discharge Instructions (Signed)
Apply Bacitracin and bandage daily until healed.

## 2015-12-11 ENCOUNTER — Emergency Department
Admission: EM | Admit: 2015-12-11 | Discharge: 2015-12-11 | Disposition: A | Discharge status: Home / Self Care | Payer: BLUE CROSS/BLUE SHIELD | Source: Home / Self Care | Attending: Family Medicine | Admitting: Family Medicine

## 2015-12-11 ENCOUNTER — Encounter: Payer: Self-pay | Admitting: Emergency Medicine

## 2015-12-11 DIAGNOSIS — J069 Acute upper respiratory infection, unspecified: Secondary | ICD-10-CM

## 2015-12-11 MED ORDER — MECLIZINE HCL 25 MG PO TABS: 25.0000 mg | ORAL_TABLET | Freq: Three times a day (TID) | ORAL | 0 refills | Status: DC | PRN

## 2015-12-11 NOTE — ED Triage Notes (Addendum)
Pt c/o dizziness upon waking this am. Started suddenly and has improved. Now ears fill full and alot of pressure.

## 2015-12-11 NOTE — Discharge Instructions (Signed)
Take plain guaifenesin (1238m extended release tabs such as Mucinex) twice daily, with plenty of water, for cough and congestion.  May add Pseudoephedrine (39m one or two every 4 to 6 hours) for sinus congestion.  Get adequate rest.   May use Afrin nasal spray (or generic oxymetazoline) twice daily for about 5 days and then discontinue.  Also recommend using saline nasal spray several times daily and saline nasal irrigation (AYR is a common brand).  Use Flonase nasal spray each morning after using Afrin nasal spray and saline nasal irrigation. Try warm salt water gargles for sore throat.  Stop all antihistamines for now, and other non-prescription cough/cold preparations. May take Ibuprofen 20040m4 tabs every 8 hours with food for headache, body aches, fever, etc. May take Delsym Cough Suppressant at bedtime for nighttime cough.  Follow-up with family doctor if not improving about 7 to 10 days.

## 2015-12-11 NOTE — ED Provider Notes (Signed)
[name]    CSN: [id number] Arrival date & time: 12/11/15  1213  First Provider Contact:  First MD Initiated Contact with Patient 12/11/15 1255        History   Chief Complaint Chief Complaint  Patient presents with  . Otalgia    HPI Mariah Curtis is a 40 y.o. female.   Patient awoke at 4am today feeling dizzy, as if the room were spinning, with nausea but no vomiting.  Her dizziness has now resolved but her ears feel full.  She has had vague soreness in her throat and myalgias.  She has a history of perennial rhinitis but no increase in congestion.     The history is provided by the patient.    Past Medical History:  Diagnosis Date  . Interstitial cystitis     There are no active problems to display for this patient.   History reviewed. No pertinent surgical history.  OB History    No data available       Home Medications    Prior to Admission medications   Medication Sig Start Date End Date Taking? Authorizing Provider  doxycycline (VIBRAMYCIN) 100 MG capsule Take two tabs by mouth as a single dose. Take with food. 11/03/15   Kandra Nicolas, MD  fexofenadine (ALLEGRA) 180 MG tablet Take 180 mg by mouth daily.    Historical Provider, MD  fluticasone (FLONASE) 50 MCG/ACT nasal spray 2 sprays per nostril daily first week, then 1 spray per nostril daily 03/20/15   Kandra Nicolas, MD  meclizine (ANTIVERT) 25 MG tablet Take 1 tablet (25 mg total) by mouth 3 (three) times daily as needed for dizziness. 12/11/15   Kandra Nicolas, MD  montelukast (SINGULAIR) 10 MG tablet Take 1 tablet (10 mg total) by mouth at bedtime. 03/20/15   Kandra Nicolas, MD  UNKNOWN TO PATIENT     Historical Provider, MD    Family History Family History  Problem Relation Age of Onset  . Heart attack Mother   . Heart disease Mother   . Heart failure Father   . Heart disease Father     Social History Social History  Substance Use Topics  . Smoking status: Never Smoker  .  Smokeless tobacco: Never Used  . Alcohol use No     Allergies   Augmentin [amoxicillin-pot clavulanate]   Review of Systems Review of Systems + minimal sore throat No cough No pleuritic pain No wheezing + nasal congestion + post-nasal drainage No sinus pain/pressure No itchy/red eyes ? earache No hemoptysis No SOB No fever/chills + nausea + dizzy No vomiting No abdominal pain No diarrhea No urinary symptoms No skin rash + fatigue + myalgias No headache Used OTC meds without relief   Physical Exam Triage Vital Signs ED Triage Vitals  Enc Vitals Group     BP 12/11/15 1242 127/82     Pulse Rate 12/11/15 1242 68     Resp --      Temp 12/11/15 1242 98.4 F (36.9 C)     Temp Source 12/11/15 1242 Oral     SpO2 12/11/15 1242 100 %     Weight 12/11/15 1242 146 lb (66.2 kg)     Height --      Head Circumference --      Peak Flow --      Pain Score 12/11/15 1243 0     Pain Loc --      Pain Edu? --  Excl. in [place]? --    No data found.   Updated Vital Signs BP 127/82 (BP Location: Right Arm)   Pulse 68   Temp 98.4 F (36.9 C) (Oral)   Wt 146 lb (66.2 kg)   SpO2 100%   BMI 26.70 kg/m   Visual Acuity Right Eye Distance:   Left Eye Distance:   Bilateral Distance:    Right Eye Near:   Left Eye Near:    Bilateral Near:     Physical Exam Nursing notes and Vital Signs reviewed. Appearance:  Patient appears stated age, and in no acute distress Eyes:  Pupils are equal, round, and reactive to light and accomodation.  Extraocular movement is intact.  Conjunctivae are not inflamed.  No nystagmus present. Ears:  Canals normal.  Tympanic membranes normal.  Nose:  Mildly congested turbinates.  No sinus tenderness.    Pharynx:  Normal Neck:  Supple.  Tender enlarged posterior/lateral nodes are palpated bilaterally  Lungs:  Clear to auscultation.  Breath sounds are equal.  Moving air well. Heart:  Regular rate and rhythm without murmurs, rubs, or gallops.    Abdomen:  Nontender without masses or hepatosplenomegaly.  Bowel sounds are present.  No CVA or flank tenderness.  Extremities:  No edema.  Skin:  No rash present.    UC Treatments / Results  Labs (all labs ordered are listed, but only abnormal results are displayed) Labs Reviewed - No data to display Tympanometry:  Right ear tympanogram normal; Left ear tympanogram normal.  EKG  EKG Interpretation None       Radiology No results found.  Procedures Procedures (including critical care time)  Medications Ordered in UC Medications - No data to display   Initial Impression / Assessment and Plan / UC Course  I have reviewed the triage vital signs and the nursing notes.  Pertinent labs & imaging results that were available during my care of the patient were reviewed by me and considered in my medical decision making (see chart for details).  Clinical Course  There is no evidence of bacterial infection today.  Suspect early symptoms of viral URI   Rx for meclizine if dizziness recurs. Take plain guaifenesin (1216m extended release tabs such as Mucinex) twice daily, with plenty of water, for cough and congestion.  May add Pseudoephedrine (323m one or two every 4 to 6 hours) for sinus congestion.  Get adequate rest.   May use Afrin nasal spray (or generic oxymetazoline) twice daily for about 5 days and then discontinue.  Also recommend using saline nasal spray several times daily and saline nasal irrigation (AYR is a common brand).  Use Flonase nasal spray each morning after using Afrin nasal spray and saline nasal irrigation. Try warm salt water gargles for sore throat.  Stop all antihistamines for now, and other non-prescription cough/cold preparations. May take Ibuprofen 20028m4 tabs every 8 hours with food for headache, body aches, fever, etc. May take Delsym Cough Suppressant at bedtime for nighttime cough.  Follow-up with family doctor if not improving about 7 to 10  days.     Final Clinical Impressions(s) / UC Diagnoses   Final diagnoses:  Viral URI    New Prescriptions New Prescriptions   MECLIZINE (ANTIVERT) 25 MG TABLET    Take 1 tablet (25 mg total) by mouth 3 (three) times daily as needed for dizziness.     SteKandra NicolasD 12/21/15 1214

## 2015-12-13 ENCOUNTER — Telehealth: Payer: Self-pay | Admitting: *Deleted

## 2015-12-13 NOTE — Telephone Encounter (Signed)
Encounter created to enter tympanometry order and result not entered on DOS.

## 2016-05-19 ENCOUNTER — Encounter: Payer: Self-pay | Admitting: *Deleted

## 2016-05-19 ENCOUNTER — Emergency Department
Admission: EM | Admit: 2016-05-19 | Discharge: 2016-05-19 | Disposition: A | Discharge status: Home / Self Care | Payer: BLUE CROSS/BLUE SHIELD | Source: Home / Self Care | Attending: Family Medicine | Admitting: Family Medicine

## 2016-05-19 DIAGNOSIS — H9203 Otalgia, bilateral: Secondary | ICD-10-CM | POA: Diagnosis not present

## 2016-05-19 HISTORY — DX: Other seasonal allergic rhinitis: J30.2

## 2016-05-19 MED ORDER — SINUS RINSE BOTTLE KIT NA PACK: 1.0000 | PACK | Freq: Two times a day (BID) | NASAL | 0 refills | Status: DC | PRN

## 2016-05-19 MED ORDER — FLUTICASONE PROPIONATE 50 MCG/ACT NA SUSP: 2.0000 | Freq: Every day | NASAL | 2 refills | Status: DC

## 2016-05-19 MED ORDER — PREDNISONE 20 MG PO TABS: ORAL_TABLET | ORAL | 0 refills | Status: DC

## 2016-05-19 NOTE — ED Triage Notes (Signed)
Pt c/o temp 99.0 and nasal congestion x 6 days, with bilateral ear pain and fluid in her ears now.

## 2016-05-19 NOTE — ED Provider Notes (Signed)
CSN: [id number]     Arrival date & time 05/19/16  7782 History   First MD Initiated Contact with Patient 05/19/16 [phone]     Chief Complaint  Patient presents with  . Otalgia   (Consider location/radiation/quality/duration/timing/severity/associated sxs/prior Treatment) HPI  Mariah Curtis is a 41 y.o. female presenting to UC with c/o nasal congestion for 6 days, temp of 99*F the first few days and bilateral ear pain, aching 2/10. Mild rhinorrhea. Denies cough or sore throat. Hx of ear pain in the past w/o ear infections. Denies n/v/d. No known sick contacts.     Past Medical History:  Diagnosis Date  . Interstitial cystitis   . Seasonal allergies    History reviewed. No pertinent surgical history. Family History  Problem Relation Age of Onset  . Heart attack Mother   . Heart disease Mother   . Heart failure Father   . Heart disease Father    Social History  Substance Use Topics  . Smoking status: Never Smoker  . Smokeless tobacco: Never Used  . Alcohol use No   OB History    No data available     Review of Systems  Constitutional: Negative for chills and fever.  HENT: Positive for congestion, ear pain ( bilateral) and rhinorrhea. Negative for sore throat, trouble swallowing and voice change.   Respiratory: Negative for cough and shortness of breath.   Cardiovascular: Negative for chest pain and palpitations.  Gastrointestinal: Negative for abdominal pain, diarrhea, nausea and vomiting.  Musculoskeletal: Negative for arthralgias, back pain and myalgias.  Skin: Negative for rash.    Allergies  Augmentin [amoxicillin-pot clavulanate]  Home Medications   Prior to Admission medications   Medication Sig Start Date End Date Taking? Authorizing Provider  fexofenadine (ALLEGRA) 180 MG tablet Take 180 mg by mouth daily.    Historical Provider, MD  fluticasone (FLONASE) 50 MCG/ACT nasal spray 2 sprays per nostril daily first week, then 1 spray per nostril daily 03/20/15   Kandra Nicolas, MD  fluticasone (FLONASE) 50 MCG/ACT nasal spray Place 2 sprays into both nostrils daily. 05/19/16   Noland Fordyce, PA-C  Hypertonic Nasal Wash (SINUS RINSE BOTTLE KIT) PACK Place 1 each into the nose 2 (two) times daily as needed. 05/19/16   Noland Fordyce, PA-C  montelukast (SINGULAIR) 10 MG tablet Take 1 tablet (10 mg total) by mouth at bedtime. 03/20/15   Kandra Nicolas, MD  predniSONE (DELTASONE) 20 MG tablet 3 tabs po day one, then 2 po daily x 4 days 05/19/16   Noland Fordyce, PA-C  UNKNOWN TO PATIENT     Historical Provider, MD   Meds Ordered and Administered this Visit  Medications - No data to display  BP 122/83 (BP Location: Left Arm)   Pulse 64   Temp 98.4 F (36.9 C) (Oral)   Resp 16   Ht 5' 1"  (1.549 m)   Wt 121 lb (54.9 kg)   LMP 04/22/2016   SpO2 99%   BMI 22.86 kg/m  No data found.   Physical Exam  Constitutional: She is oriented to person, place, and time. She appears well-developed and well-nourished. No distress.  HENT:  Head: Normocephalic and atraumatic.  Right Ear: Tympanic membrane normal.  Left Ear: Tympanic membrane normal.  Nose: Nose normal.  Mouth/Throat: Uvula is midline, oropharynx is clear and moist and mucous membranes are normal.  Eyes: EOM are normal.  Neck: Normal range of motion. Neck supple.  Cardiovascular: Normal rate and regular rhythm.   Pulmonary/Chest:  Effort normal and breath sounds normal. No stridor. No respiratory distress. She has no wheezes. She has no rales.  Musculoskeletal: Normal range of motion.  Lymphadenopathy:    She has no cervical adenopathy.  Neurological: She is alert and oriented to person, place, and time.  Skin: Skin is warm and dry. She is not diaphoretic.  Psychiatric: She has a normal mood and affect. Her behavior is normal.  Nursing note and vitals reviewed.   Urgent Care Course     Procedures (including critical care time)  Labs Review Labs Reviewed - No data to display  Imaging Review No  results found.  Tympanogram: Normal in Left and Right ear  MDM   1. Acute ear pain, bilateral    Pt c/o 6 days of nasal congestion and bilateral ear pain. No evidence of bacterial infection on exam.  Encouraged symptomatic treatment, including sinus rinses. Rx: Flonase and prednisone   F/u with PCP in 1 week if not improving.     Noland Fordyce, PA-C 05/19/16 [phone]

## 2017-11-18 ENCOUNTER — Emergency Department (INDEPENDENT_AMBULATORY_CARE_PROVIDER_SITE_OTHER): Payer: BLUE CROSS/BLUE SHIELD

## 2017-11-18 ENCOUNTER — Encounter: Payer: Self-pay | Admitting: Emergency Medicine

## 2017-11-18 ENCOUNTER — Emergency Department
Admission: EM | Admit: 2017-11-18 | Discharge: 2017-11-18 | Disposition: A | Discharge status: Home / Self Care | Payer: BLUE CROSS/BLUE SHIELD | Source: Home / Self Care | Attending: Family Medicine | Admitting: Family Medicine

## 2017-11-18 DIAGNOSIS — M79642 Pain in left hand: Secondary | ICD-10-CM | POA: Diagnosis not present

## 2017-11-18 DIAGNOSIS — S6992XA Unspecified injury of left wrist, hand and finger(s), initial encounter: Secondary | ICD-10-CM

## 2017-11-18 DIAGNOSIS — S60222A Contusion of left hand, initial encounter: Secondary | ICD-10-CM | POA: Diagnosis not present

## 2017-11-18 DIAGNOSIS — M25532 Pain in left wrist: Secondary | ICD-10-CM | POA: Diagnosis not present
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
## 2017-11-18 NOTE — Discharge Instructions (Signed)
°  You may take 533m acetaminophen every 4-6 hours or in combination with ibuprofen 400-6072mevery 6-8 hours as needed for pain and inflammation.  Please follow up with family medicine in 1-2 weeks if not improving, sooner if worsening.

## 2020-02-28 ENCOUNTER — Emergency Department (INDEPENDENT_AMBULATORY_CARE_PROVIDER_SITE_OTHER)
Admission: EM | Admit: 2020-02-28 | Discharge: 2020-02-28 | Disposition: A | Discharge status: Home / Self Care | Payer: BC Managed Care – PPO | Source: Home / Self Care | Attending: Family Medicine | Admitting: Family Medicine

## 2020-02-28 ENCOUNTER — Emergency Department (INDEPENDENT_AMBULATORY_CARE_PROVIDER_SITE_OTHER): Payer: BC Managed Care – PPO

## 2020-02-28 ENCOUNTER — Other Ambulatory Visit: Payer: Self-pay

## 2020-02-28 DIAGNOSIS — U071 COVID-19: Secondary | ICD-10-CM | POA: Diagnosis not present

## 2020-02-28 DIAGNOSIS — Z8616 Personal history of COVID-19: Secondary | ICD-10-CM

## 2020-02-28 DIAGNOSIS — R053 Chronic cough: Secondary | ICD-10-CM

## 2020-02-28 MED ORDER — PREDNISONE 20 MG PO TABS: ORAL_TABLET | ORAL | 0 refills | Status: DC

## 2020-02-28 NOTE — Discharge Instructions (Addendum)
Take plain guaifenesin (1244m extended release tabs such as Mucinex) twice daily, with plenty of water, for cough and congestion.  If needed, may add Pseudoephedrine (362m one or two every 4 to 6 hours) for sinus congestion.  Get adequate rest.   Continue saline nasal irrigation. Try warm salt water gargles for sore throat.  Stop all antihistamines for now, and other non-prescription cough/cold preparations. May take Delsym Cough Suppressant ("12 Hour Cough Relief") at bedtime for nighttime cough.

## 2020-02-28 NOTE — ED Provider Notes (Signed)
[name]    CSN: [id number] Arrival date & time: 02/28/20  0801      History   Chief Complaint Chief Complaint  Patient presents with  . Chest congestion    COVID POS 11/9    HPI Mariah Curtis is a 44 y.o. female.   Patient reports that she developed URI symptoms 11 days ago with initial fever and mild cough.  She did not feel unusually ill but COVID test was positive.  She had a mild change in her taste/smell that rapidly resolved.  She now complains of a persistent non-productive cough.  Several days ago she had a mild ache in her left anterior chest when cough, now improved. Her entire family has COVID (husband and children) She has a past history of pneumonia as a child.  The history is provided by the patient.    Past Medical History:  Diagnosis Date  . Interstitial cystitis   . Seasonal allergies     There are no problems to display for this patient.   History reviewed. No pertinent surgical history.  OB History   No obstetric history on file.      Home Medications    Prior to Admission medications   Medication Sig Start Date End Date Taking? Authorizing Provider  predniSONE (DELTASONE) 20 MG tablet Take one tab by mouth twice daily for 3 days, then one daily for 2 days. Take with food. 02/28/20   Kandra Nicolas, MD  UNKNOWN TO PATIENT     [provider]    Family History Family History  Problem Relation Age of Onset  . Heart attack Mother   . Heart disease Mother   . Heart failure Father   . Heart disease Father     Social History Social History   Tobacco Use  . Smoking status: Never Smoker  . Smokeless tobacco: Never Used  Vaping Use  . Vaping Use: Never used  Substance Use Topics  . Alcohol use: No  . Drug use: No     Allergies   Amoxicillin-pot clavulanate   Review of Systems Review of Systems No sore throat + cough No pleuritic pain No wheezing No nasal congestion No post-nasal drainage No  sinus pain/pressure No itchy/red eyes No earache No hemoptysis No SOB No fever/chills No nausea No vomiting No abdominal pain No diarrhea No urinary symptoms No skin rash No fatigue No myalgias No headache    Physical Exam Triage Vital Signs ED Triage Vitals  Enc Vitals Group     BP 02/28/20 0818 130/84     Pulse Rate 02/28/20 0818 73     Resp 02/28/20 0818 18     Temp 02/28/20 0818 98.9 F (37.2 C)     Temp Source 02/28/20 0818 Oral     SpO2 02/28/20 0818 98 %     Weight --      Height --      Head Circumference --      Peak Flow --      Pain Score 02/28/20 0819 1     Pain Loc --      Pain Edu? --      Excl. in [place]? --    No data found.  Updated Vital Signs BP 130/84 (BP Location: Right Arm)   Pulse 73   Temp 98.9 F (37.2 C) (Oral)   Resp 18   LMP 02/07/2020 (Approximate)   SpO2 98%   Visual Acuity Right Eye Distance:   Left Eye  Distance:   Bilateral Distance:    Right Eye Near:   Left Eye Near:    Bilateral Near:     Physical Exam Nursing notes and Vital Signs reviewed. Appearance:  Patient appears stated age, and in no acute distress Eyes:  Pupils are equal, round, and reactive to light and accomodation.  Extraocular movement is intact.  Conjunctivae are not inflamed  Ears:  Canals normal.  Tympanic membranes normal.  Nose:  Mildly congested turbinates.  No sinus tenderness. Marland Kitchen  Pharynx:  Normal Neck:  Supple.  No adenopathy Lungs:  Clear to auscultation.  Breath sounds are equal.  Moving air well. Heart:  Regular rate and rhythm without murmurs, rubs, or gallops.  Abdomen:  Nontender without masses or hepatosplenomegaly.  Bowel sounds are present.  No CVA or flank tenderness.  Extremities:  No edema.  Skin:  No rash present.   UC Treatments / Results  Labs (all labs ordered are listed, but only abnormal results are displayed) Labs Reviewed - No data to display  EKG   Radiology DG Chest 2 View  Result Date: 02/28/2020 CLINICAL DATA:   Persistent cough.  Diagnosed with COVID 11 days ago. EXAM: CHEST - 2 VIEW COMPARISON:  03/20/2015. FINDINGS: The heart size and mediastinal contours are within normal limits. No consolidation. No visible pleural effusions or pneumothorax. The visualized skeletal structures are unremarkable. IMPRESSION: No active cardiopulmonary disease. Electronically Signed   By: Margaretha Sheffield MD   On: 02/28/2020 09:23    Procedures Procedures (including critical care time)  Medications Ordered in UC Medications - No data to display  Initial Impression / Assessment and Plan / UC Course  I have reviewed the triage vital signs and the nursing notes.  Pertinent labs & imaging results that were available during my care of the patient were reviewed by me and considered in my medical decision making (see chart for details).    Benign exam.  Normal chest x-ray reassuring.  Suspect post-infectious cough. Begin prednisone burst/taper. Followup with Family Doctor if not improved in about 6 days.   Final Clinical Impressions(s) / UC Diagnoses   Final diagnoses:  Personal history of COVID-19  Persistent cough     Discharge Instructions     Take plain guaifenesin (1265m extended release tabs such as Mucinex) twice daily, with plenty of water, for cough and congestion.  If needed, may add Pseudoephedrine (323m one or two every 4 to 6 hours) for sinus congestion.  Get adequate rest.   Continue saline nasal irrigation. Try warm salt water gargles for sore throat.  Stop all antihistamines for now, and other non-prescription cough/cold preparations. May take Delsym Cough Suppressant ("12 Hour Cough Relief") at bedtime for nighttime cough.     ED Prescriptions    Medication Sig Dispense Auth. Provider   predniSONE (DELTASONE) 20 MG tablet Take one tab by mouth twice daily for 3 days, then one daily for 2 days. Take with food. 8 tablet BeKandra NicolasMD        BeKandra NicolasMD 03/08/20  [phone]

## 2020-02-28 NOTE — ED Triage Notes (Signed)
Pt here today for chest congestion, coughing clear phlegm but getting worse/thick. COVID pos 11/9, whole family sick. Husband dx with viral pneumonia yesterday. Has not had COVID vaccinations.

## 2020-03-03 ENCOUNTER — Other Ambulatory Visit: Payer: Self-pay

## 2020-03-03 ENCOUNTER — Emergency Department (INDEPENDENT_AMBULATORY_CARE_PROVIDER_SITE_OTHER)
Admission: RE | Admit: 2020-03-03 | Discharge: 2020-03-03 | Disposition: A | Discharge status: Home / Self Care | Payer: BC Managed Care – PPO | Source: Ambulatory Visit | Attending: Family Medicine | Admitting: Family Medicine

## 2020-03-03 VITALS — BP 137/87 | HR 75 | Temp 98.4°F | Resp 18

## 2020-03-03 DIAGNOSIS — R0981 Nasal congestion: Secondary | ICD-10-CM

## 2020-03-03 DIAGNOSIS — R430 Anosmia: Secondary | ICD-10-CM

## 2020-03-03 MED ORDER — CEFDINIR 300 MG PO CAPS: 300.0000 mg | ORAL_CAPSULE | Freq: Two times a day (BID) | ORAL | 0 refills | Status: DC

## 2020-03-03 NOTE — ED Triage Notes (Addendum)
Pt returns today for continued sinus issues. 2 weeks post covid. Says developed fever the night after UC visit on 11/20. Has since resolved. Still coughing up mucous. Worried about possible sinus infection. Finished prednisone rx this morning. Has not had covid vaccinations.

## 2020-03-03 NOTE — ED Provider Notes (Signed)
[name]    CSN: [id number] Arrival date & time: 03/03/20  1244      History   Chief Complaint Chief Complaint  Patient presents with  . Sinus issues    Appt 2pm  . Cough    HPI Mariah Curtis is a 44 y.o. female.   Patient is now two weeks post COVID infection.  Her fever has resolved but she still has some cough and persistent nasal congestion and facial pressure.  She still cannot taste/smell.  She denies pleuritic pain and shortness of breath.  She finished prednisone RX this morning.  The history is provided by the patient.    Past Medical History:  Diagnosis Date  . Interstitial cystitis   . Seasonal allergies     There are no problems to display for this patient.   History reviewed. No pertinent surgical history.  OB History   No obstetric history on file.      Home Medications    Prior to Admission medications   Medication Sig Start Date End Date Taking? Authorizing Provider  cefdinir (OMNICEF) 300 MG capsule Take 1 capsule (300 mg total) by mouth 2 (two) times daily. 03/03/20   Kandra Nicolas, MD  UNKNOWN TO PATIENT     [provider]    Family History Family History  Problem Relation Age of Onset  . Heart attack Mother   . Heart disease Mother   . Heart failure Father   . Heart disease Father     Social History Social History   Tobacco Use  . Smoking status: Never Smoker  . Smokeless tobacco: Never Used  Vaping Use  . Vaping Use: Never used  Substance Use Topics  . Alcohol use: No  . Drug use: No     Allergies   Amoxicillin-pot clavulanate   Review of Systems Review of Systems  No sore throat + cough No pleuritic pain No wheezing + nasal congestion + post-nasal drainage + sinus pain/pressure No itchy/red eyes No earache No hemoptysis No SOB No fever/chills No nausea No vomiting No abdominal pain No diarrhea No urinary symptoms No skin rash + fatigue No myalgias No headache     Physical Exam Triage Vital Signs ED Triage Vitals [03/03/20 1254]  Enc Vitals Group     BP 137/87     Pulse Rate 75     Resp 18     Temp 98.4 F (36.9 C)     Temp Source Oral     SpO2 99 %     Weight      Height      Head Circumference      Peak Flow      Pain Score 0     Pain Loc      Pain Edu?      Excl. in [place]?    No data found.  Updated Vital Signs BP 137/87 (BP Location: Right Arm)   Pulse 75   Temp 98.4 F (36.9 C) (Oral)   Resp 18   LMP 02/07/2020 (Approximate)   SpO2 99%   Visual Acuity Right Eye Distance:   Left Eye Distance:   Bilateral Distance:    Right Eye Near:   Left Eye Near:    Bilateral Near:     Physical Exam Nursing notes and Vital Signs reviewed. Appearance:  Patient appears stated age, and in no acute distress Eyes:  Pupils are equal, round, and reactive to light and accomodation.  Extraocular movement is intact.  Conjunctivae are not inflamed  Ears:  Canals normal.  Tympanic membranes normal.  Nose:  Mildly congested turbinates.   Maxillary sinus tenderness is present.  Pharynx:  Normal Neck:  Supple. No adenopathy. Lungs:  Clear to auscultation.  Breath sounds are equal.  Moving air well. Heart:  Regular rate and rhythm without murmurs, rubs, or gallops.  Abdomen:  Nontender without masses or hepatosplenomegaly.  Bowel sounds are present.  No CVA or flank tenderness.  Extremities:  No edema.  Skin:  No rash present.   UC Treatments / Results  Labs (all labs ordered are listed, but only abnormal results are displayed) Labs Reviewed - No data to display  EKG   Radiology No results found.  Procedures Procedures (including critical care time)  Medications Ordered in UC Medications - No data to display  Initial Impression / Assessment and Plan / UC Course  I have reviewed the triage vital signs and the nursing notes.  Pertinent labs & imaging results that were available during my care of the patient were reviewed by me  and considered in my medical decision making (see chart for details).    ?Sinusitis; begin Omnicef. Followup with Family Doctor if not improved in about 10 days.   Final Clinical Impressions(s) / UC Diagnoses   Final diagnoses:  Sinus congestion  Anosmia     Discharge Instructions     Recommend using saline nasal spray several times daily and saline nasal irrigation (AYR is a common brand).  Use Flonase nasal spray each morning after using saline nasal irrigation.      ED Prescriptions    Medication Sig Dispense Auth. Provider   cefdinir (OMNICEF) 300 MG capsule Take 1 capsule (300 mg total) by mouth 2 (two) times daily. 20 capsule Kandra Nicolas, MD        Kandra Nicolas, MD 03/10/20 [phone]

## 2020-03-03 NOTE — Discharge Instructions (Addendum)
Recommend using saline nasal spray several times daily and saline nasal irrigation (AYR is a common brand).  Use Flonase nasal spray each morning after using saline nasal irrigation.

## 2020-11-29 IMAGING — DX DG CHEST 2V
2 series · 2 of 2 positions shown · non-contrast
Comparison: 03/20/2015.

CLINICAL DATA: Persistent cough.  Diagnosed with COVID 11 days ago.

EXAM:
CHEST - 2 VIEW

[chest pa]
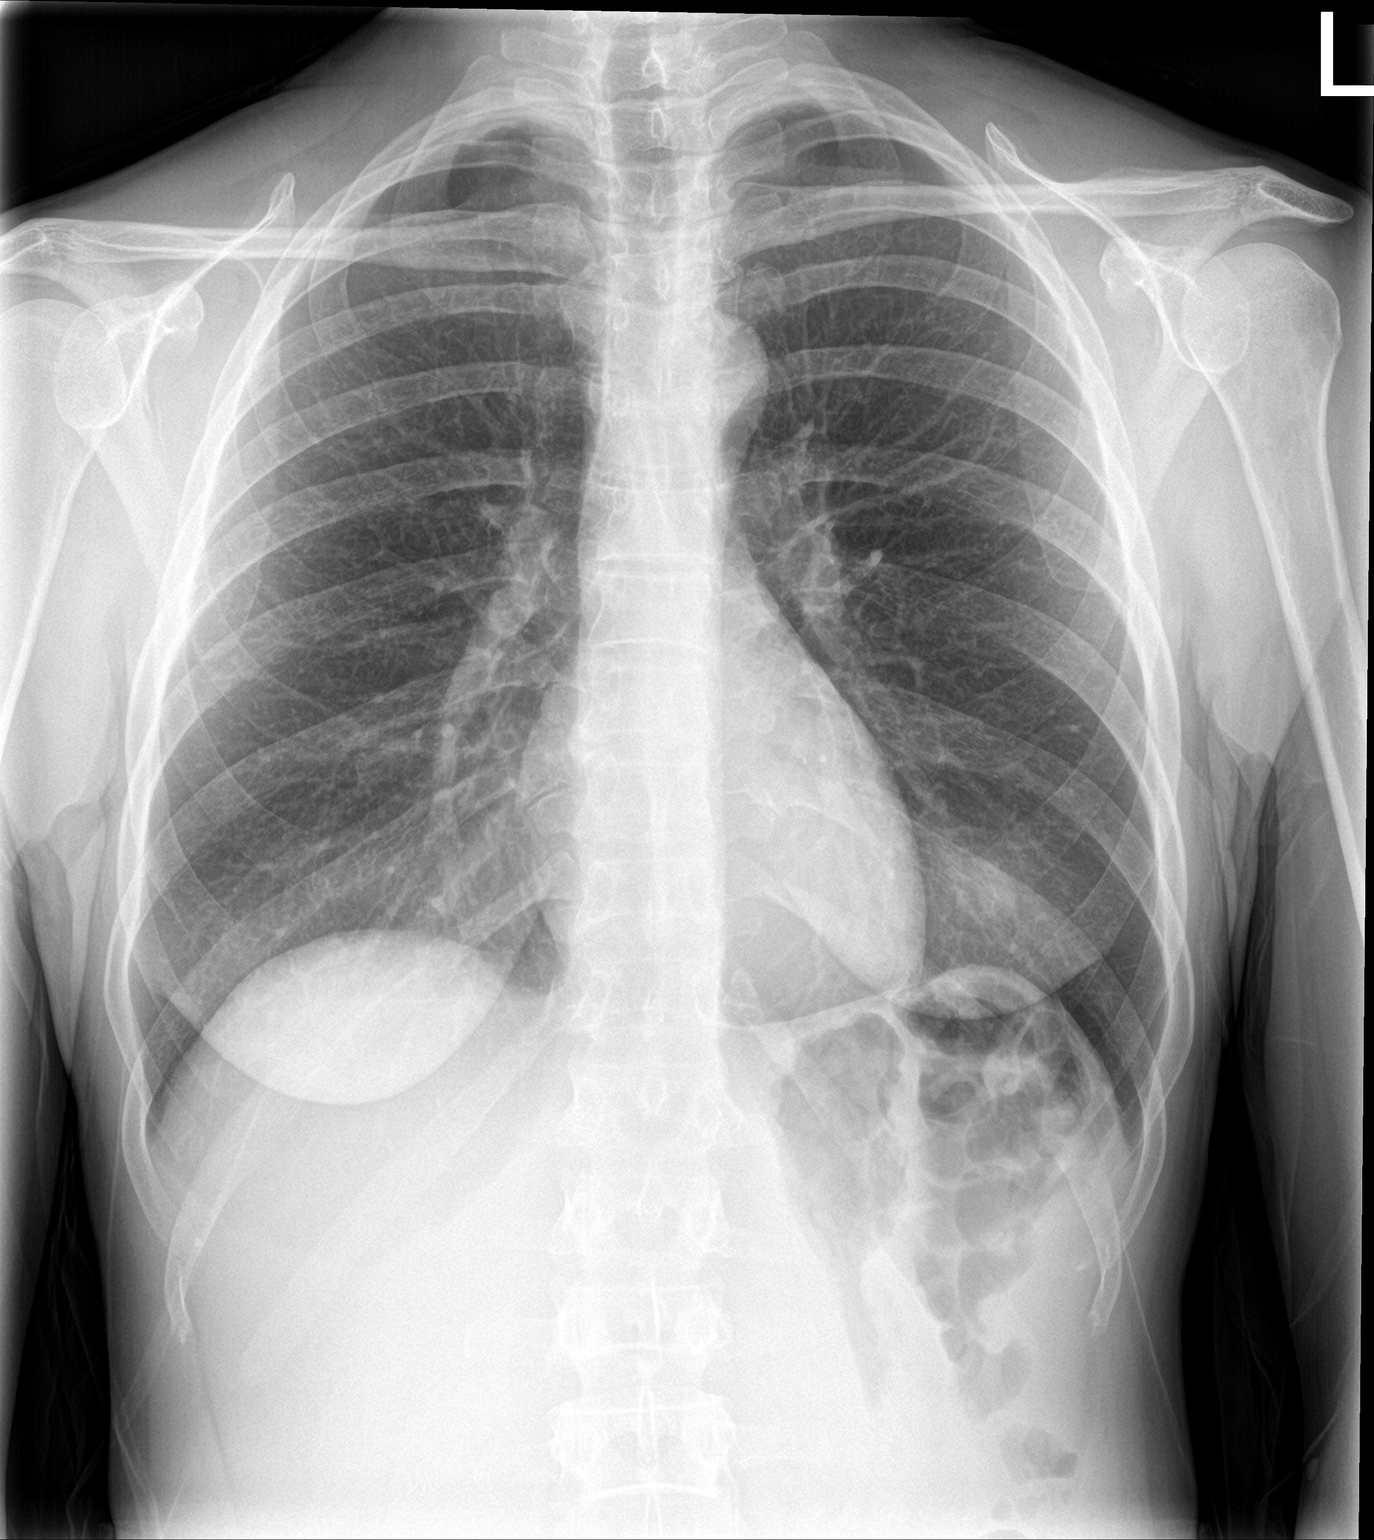

[chest lat]
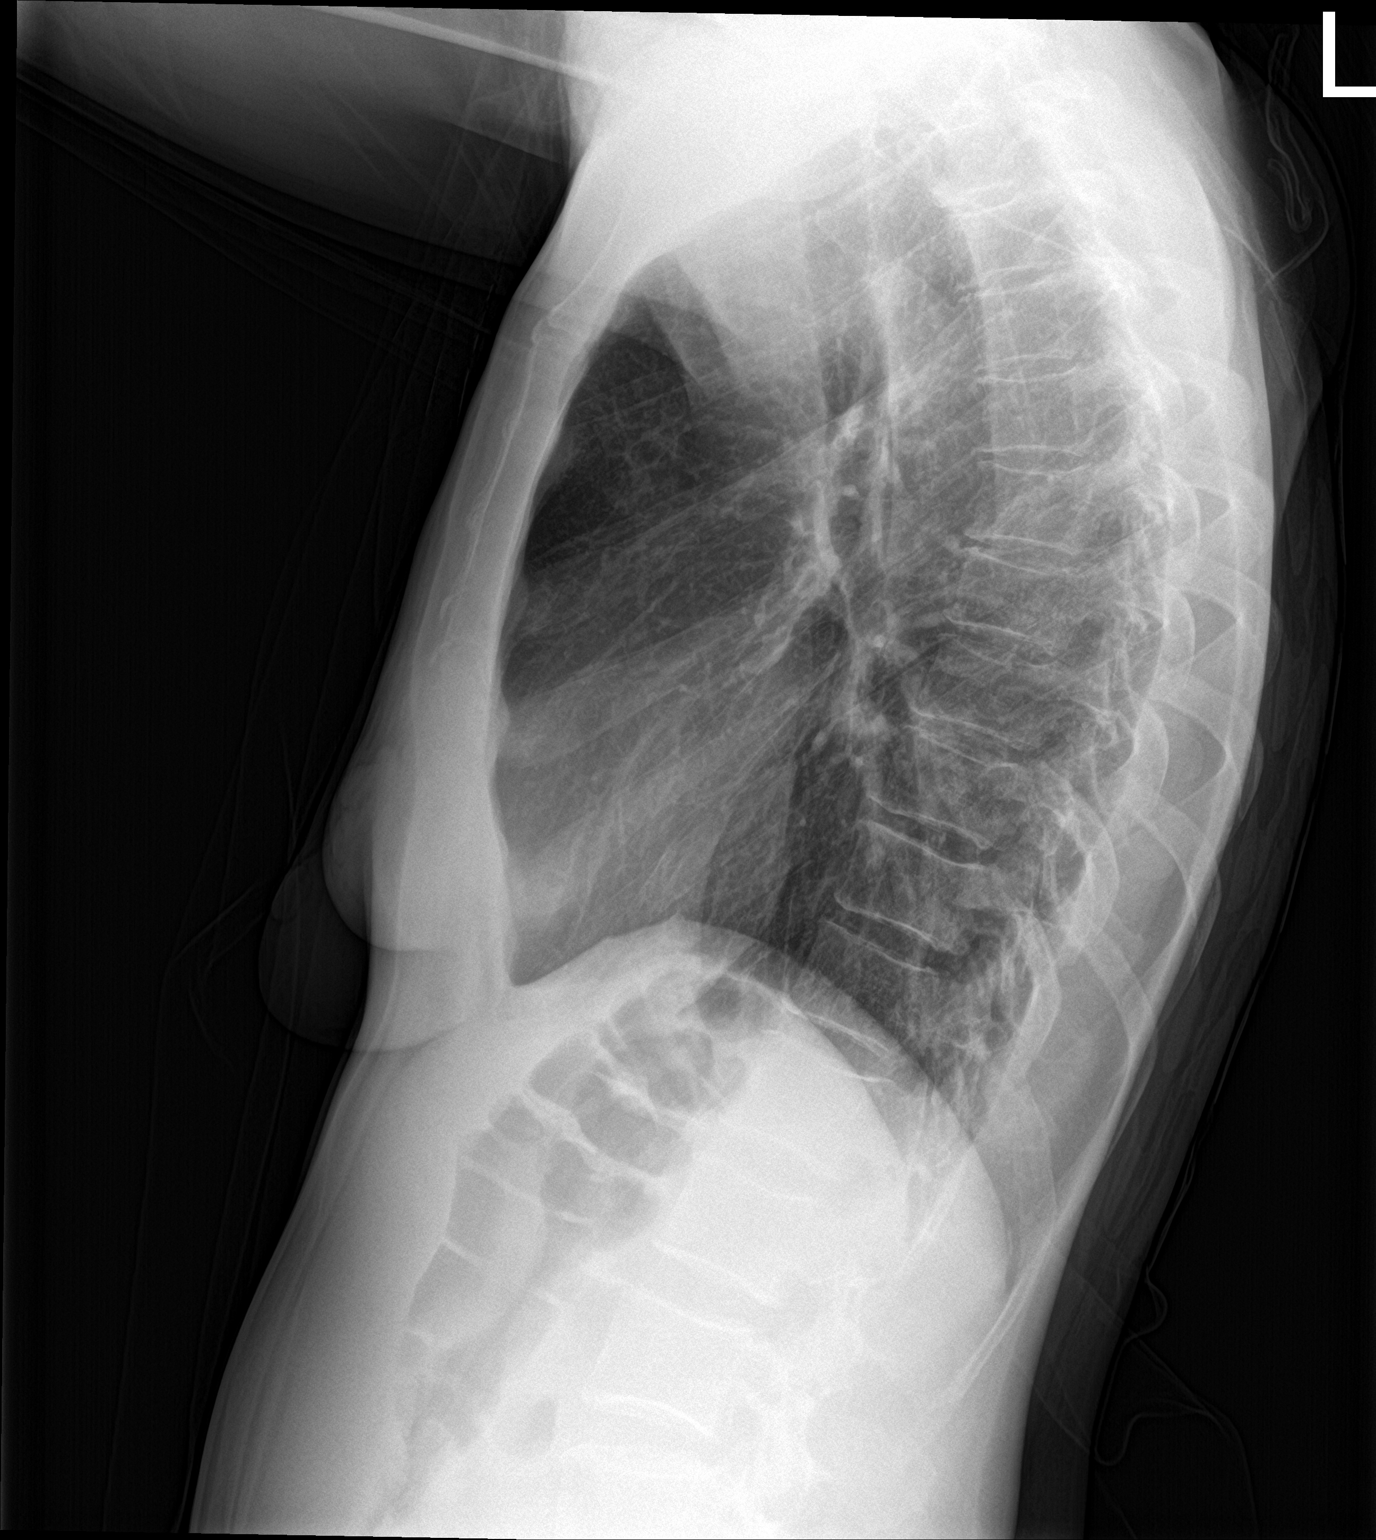

[2 of 2 positions shown; findings below may reference images not displayed]

FINDINGS: The heart size and mediastinal contours are within normal limits. No
consolidation. No visible pleural effusions or pneumothorax. The
visualized skeletal structures are unremarkable.
IMPRESSION: No active cardiopulmonary disease.

## 2021-02-04 ENCOUNTER — Other Ambulatory Visit: Payer: Self-pay

## 2021-02-04 ENCOUNTER — Emergency Department (INDEPENDENT_AMBULATORY_CARE_PROVIDER_SITE_OTHER)
Admission: EM | Admit: 2021-02-04 | Discharge: 2021-02-04 | Disposition: A | Discharge status: Home / Self Care | Payer: BC Managed Care – PPO | Source: Home / Self Care | Attending: Family Medicine | Admitting: Family Medicine

## 2021-02-04 ENCOUNTER — Encounter: Payer: Self-pay | Admitting: Emergency Medicine

## 2021-02-04 DIAGNOSIS — R509 Fever, unspecified: Secondary | ICD-10-CM | POA: Diagnosis not present

## 2021-02-04 LAB — CBC WITH DIFFERENTIAL/PLATELET
Absolute Monocytes: 354 cells/uL (ref 200–950)
Basophils Absolute: 60 cells/uL (ref 0–200)
Basophils Relative: 1.3 %
Eosinophils Absolute: 133 cells/uL (ref 15–500)
Eosinophils Relative: 2.9 %
HCT: 42.3 % (ref 35.0–45.0)
Hemoglobin: 13.8 g/dL (ref 11.7–15.5)
Lymphs Abs: 1647 cells/uL (ref 850–3900)
MCH: 28.4 pg (ref 27.0–33.0)
MCHC: 32.6 g/dL (ref 32.0–36.0)
MCV: 87 fL (ref 80.0–100.0)
MPV: 12.7 fL — ABNORMAL HIGH (ref 7.5–12.5)
Monocytes Relative: 7.7 %
Neutro Abs: 2406 cells/uL (ref 1500–7800)
Neutrophils Relative %: 52.3 %
Platelets: 243 10*3/uL (ref 140–400)
RBC: 4.86 10*6/uL (ref 3.80–5.10)
RDW: 12.8 % (ref 11.0–15.0)
Total Lymphocyte: 35.8 %
WBC: 4.6 10*3/uL (ref 3.8–10.8)

## 2021-02-04 LAB — POCT URINALYSIS DIP (MANUAL ENTRY)
Bilirubin, UA: NEGATIVE
Glucose, UA: NEGATIVE mg/dL
Ketones, POC UA: NEGATIVE mg/dL
Leukocytes, UA: NEGATIVE
Nitrite, UA: NEGATIVE
Protein Ur, POC: NEGATIVE mg/dL
Spec Grav, UA: 1.015 (ref 1.010–1.025)
Urobilinogen, UA: 0.2 E.U./dL
pH, UA: 5.5 (ref 5.0–8.0)

## 2021-02-04 NOTE — Discharge Instructions (Signed)
Please monitor your temperature several times weekly, at different times of the day, record on a calendar with times of measurement and take this record to your Family Doctor if fever persists. Rest and stay well hydrated.

## 2021-02-04 NOTE — ED Triage Notes (Signed)
Pt c/o bilateral ear pain and fever of 100 intermittently since Saturday. States she has hx of ear infections.

## 2021-02-04 NOTE — ED Provider Notes (Signed)
[name]    CSN: [id number] Arrival date & time: 02/04/21  0801      History   Chief Complaint Chief Complaint  Patient presents with   Otalgia    HPI Mariah Curtis is a 45 y.o. female.   Patient complains of having a persistent low grade fever (100.4 to 100.8) for one week.  She has had fatigue and occasional chills.  During the past 2 to 3 days she has had a sensation of ear congestion.  Today she has had decreased appetite and mild nausea.  She denies cough, sore throat, and urinary symptoms.  She has seasonal rhinitis for which she receives immunotherapy. She states that her son has had mild URI symptoms for several days.  She had a negative home COVID19 test two days ago.  The history is provided by the patient.   Past Medical History:  Diagnosis Date   Interstitial cystitis    Seasonal allergies     There are no problems to display for this patient.   History reviewed. No pertinent surgical history.  OB History   No obstetric history on file.      Home Medications    Prior to Admission medications   Medication Sig Start Date End Date Taking? Authorizing Provider  UNKNOWN TO PATIENT     [provider]    Family History Family History  Problem Relation Age of Onset   Heart attack Mother    Heart disease Mother    Heart failure Father    Heart disease Father     Social History Social History   Tobacco Use   Smoking status: Never   Smokeless tobacco: Never  Vaping Use   Vaping Use: Never used  Substance Use Topics   Alcohol use: No   Drug use: No     Allergies   Amoxicillin-pot clavulanate   Review of Systems Review of Systems ? mild sore throat No cough No pleuritic pain No wheezing No nasal congestion No post-nasal drainage No sinus pain/pressure No itchy/red eyes ? Earache, ears feel full No hemoptysis No SOB + fever, + chills + nausea No vomiting No abdominal pain No diarrhea No urinary  symptoms No skin rash + fatigue No myalgias No headache Used OTC meds (Flonase) without relief   Physical Exam Triage Vital Signs ED Triage Vitals  Enc Vitals Group     BP 02/04/21 0818 122/74     Pulse Rate 02/04/21 0818 68     Resp 02/04/21 0818 18     Temp 02/04/21 0818 98.3 F (36.8 C)     Temp Source 02/04/21 0818 Oral     SpO2 02/04/21 0818 99 %     Weight 02/04/21 0821 115 lb (52.2 kg)     Height --      Head Circumference --      Peak Flow --      Pain Score 02/04/21 0821 5     Pain Loc --      Pain Edu? --      Excl. in [place]? --    No data found.  Updated Vital Signs BP 122/74 (BP Location: Right Arm)   Pulse 68   Temp 98.3 F (36.8 C) (Oral)   Resp 18   Wt 52.2 kg   SpO2 99%   BMI 21.73 kg/m   Visual Acuity Right Eye Distance:   Left Eye Distance:   Bilateral Distance:    Right Eye Near:   Left Eye  Near:    Bilateral Near:     Physical Exam Nursing notes and Vital Signs reviewed. Appearance:  Patient appears stated age, and in no acute distress Eyes:  Pupils are equal, round, and reactive to light and accomodation.  Extraocular movement is intact.  Conjunctivae are not inflamed  Ears:  Canals normal.  Tympanic membranes normal.  Nose:  Mildly congested turbinates.  No sinus tenderness.   Pharynx:  Normal Neck:  Supple.  Mildly enlarged lateral nodes are present, tender to palpation on the left.   Lungs:  Clear to auscultation.  Breath sounds are equal.  Moving air well. Heart:  Regular rate and rhythm without murmurs, rubs, or gallops.  Abdomen:  Nontender without masses or hepatosplenomegaly.  Bowel sounds are present.  No CVA or flank tenderness.  Extremities:  No edema.  Skin:  No rash present.   UC Treatments / Results  Labs (all labs ordered are listed, but only abnormal results are displayed) Labs Reviewed -  Tympanometry:  Right ear tympanogram normal; Left ear tympanogram normal  EKG   Radiology No results  found.  Procedures Procedures (including critical care time)  Medications Ordered in UC Medications - No data to display  Initial Impression / Assessment and Plan / UC Course  I have reviewed the triage vital signs and the nursing notes.  Pertinent labs & imaging results that were available during my care of the patient were reviewed by me and considered in my medical decision making (see chart for details).    No evidence otitis media.  Suspect developing URI (son has a mild URI concurrently). Urinalysis normal except mild hematuria (menses at present).  CBC pending. Followup with Family Doctor if not improved in one week.   Final Clinical Impressions(s) / UC Diagnoses   Final diagnoses:  Fever, unspecified     Discharge Instructions      Please monitor your temperature several times weekly, at different times of the day, record on a calendar with times of measurement and take this record to your Family Doctor if fever persists. Rest and stay well hydrated.      ED Prescriptions   None       Kandra Nicolas, MD 02/05/21 1814

## 2021-05-12 ENCOUNTER — Other Ambulatory Visit: Payer: Self-pay

## 2021-05-12 ENCOUNTER — Emergency Department (INDEPENDENT_AMBULATORY_CARE_PROVIDER_SITE_OTHER)
Admission: EM | Admit: 2021-05-12 | Discharge: 2021-05-12 | Disposition: A | Discharge status: Home / Self Care | Payer: BC Managed Care – PPO | Source: Home / Self Care

## 2021-05-12 DIAGNOSIS — J029 Acute pharyngitis, unspecified: Secondary | ICD-10-CM

## 2021-05-12 DIAGNOSIS — R509 Fever, unspecified: Secondary | ICD-10-CM

## 2021-05-12 MED ORDER — AZITHROMYCIN 250 MG PO TABS: 250.0000 mg | ORAL_TABLET | Freq: Every day | ORAL | 0 refills | Status: DC

## 2021-05-12 NOTE — Discharge Instructions (Addendum)
Advised/instructed patient to hold Zithromax for the next 2 to 3 days prior to starting.  Advised if starting this antibiotic please take to completion.  Encouraged patient to increase daily water intake while taking this medication.  Advised patient may alternate between Tylenol 1000 mg 1-2 times daily, as needed with OTC Ibuprofen 400 mg 1-2 times daily, as needed for fever and myalgias.

## 2021-05-12 NOTE — ED Triage Notes (Signed)
Pt c/o fever and sore throat x 3 days. Fever at home was 100.6 tympanic 30 mins ago. Takes allergy med daily. Flonase prn. COVID in Nov 2021. Intermittent fevers ever since.

## 2021-05-12 NOTE — ED Provider Notes (Signed)
[name]    CSN: [id number] Arrival date & time: 05/12/21  1900      History   Chief Complaint Chief Complaint  Patient presents with   Fever   Sore Throat    HPI Mariah Curtis is a 46 y.o. female.   HPI 46 year old female presents with sore throat and fever for 3 days.  PMH significant for interstitial cystitis.  Past Medical History:  Diagnosis Date   Interstitial cystitis    Seasonal allergies     There are no problems to display for this patient.   History reviewed. No pertinent surgical history.  OB History   No obstetric history on file.      Home Medications    Prior to Admission medications   Medication Sig Start Date End Date Taking? Authorizing Provider  azithromycin (ZITHROMAX) 250 MG tablet Take 1 tablet (250 mg total) by mouth daily. Take first 2 tablets together, then 1 every day until finished. 05/12/21  Yes Eliezer Lofts, FNP  UNKNOWN TO PATIENT     [provider]    Family History Family History  Problem Relation Age of Onset   Heart attack Mother    Heart disease Mother    Heart failure Father    Heart disease Father     Social History Social History   Tobacco Use   Smoking status: Never   Smokeless tobacco: Never  Vaping Use   Vaping Use: Never used  Substance Use Topics   Alcohol use: No   Drug use: No     Allergies   Amoxicillin-pot clavulanate   Review of Systems Review of Systems  Constitutional:  Positive for fever.  HENT:  Positive for sore throat.     Physical Exam Triage Vital Signs ED Triage Vitals  Enc Vitals Group     BP      Pulse      Resp      Temp      Temp src      SpO2      Weight      Height      Head Circumference      Peak Flow      Pain Score      Pain Loc      Pain Edu?      Excl. in [place]?    No data found.  Updated Vital Signs BP (!) 153/88 (BP Location: Right Arm)    Pulse 82    Temp 99.3 F (37.4 C) (Oral)    Resp 18    LMP  (LMP Unknown)    SpO2 100%        Physical Exam Vitals and nursing note reviewed.  Constitutional:      General: She is not in acute distress.    Appearance: Normal appearance. She is normal weight. She is not ill-appearing.  HENT:     Head: Normocephalic and atraumatic.     Right Ear: Tympanic membrane, ear canal and external ear normal.     Left Ear: Tympanic membrane, ear canal and external ear normal.     Mouth/Throat:     Mouth: Mucous membranes are moist.     Pharynx: Oropharynx is clear. Uvula midline. Posterior oropharyngeal erythema and uvula swelling present.  Eyes:     Extraocular Movements: Extraocular movements intact.     Conjunctiva/sclera: Conjunctivae normal.     Pupils: Pupils are equal, round, and reactive to light.  Cardiovascular:     Rate and  Rhythm: Normal rate and regular rhythm.     Pulses: Normal pulses.     Heart sounds: Normal heart sounds.  Pulmonary:     Effort: Pulmonary effort is normal.     Breath sounds: Normal breath sounds.  Musculoskeletal:     Cervical back: Normal range of motion and neck supple. No tenderness.  Lymphadenopathy:     Cervical: No cervical adenopathy.  Skin:    General: Skin is warm and dry.  Neurological:     General: No focal deficit present.     Mental Status: She is alert and oriented to person, place, and time.     UC Treatments / Results  Labs (all labs ordered are listed, but only abnormal results are displayed) Labs Reviewed - No data to display  EKG   Radiology No results found.  Procedures Procedures (including critical care time)  Medications Ordered in UC Medications - No data to display  Initial Impression / Assessment and Plan / UC Course  I have reviewed the triage vital signs and the nursing notes.  Pertinent labs & imaging results that were available during my care of the patient were reviewed by me and considered in my medical decision making (see chart for details).     MDM: 1.  Pharyngitis-Rx'd Zithromax; 2.   Fever-Advised patient may alternate between Tylenol 1000 mg 1-2 times daily, as needed with OTC Ibuprofen 400 mg 1-2 times daily, as needed for fever and myalgias.  Discharged home, hemodynamically stable. Final Clinical Impressions(s) / UC Diagnoses   Final diagnoses:  Fever, unspecified  Pharyngitis, unspecified etiology     Discharge Instructions      Advised/instructed patient to hold Zithromax for the next 2 to 3 days prior to starting.  Advised if starting this antibiotic please take to completion.  Encouraged patient to increase daily water intake while taking this medication.  Advised patient may alternate between Tylenol 1000 mg 1-2 times daily, as needed with OTC Ibuprofen 400 mg 1-2 times daily, as needed for fever and myalgias.     ED Prescriptions     Medication Sig Dispense Auth. Provider   azithromycin (ZITHROMAX) 250 MG tablet Take 1 tablet (250 mg total) by mouth daily. Take first 2 tablets together, then 1 every day until finished. 6 tablet Eliezer Lofts, FNP      PDMP not reviewed this encounter.   Eliezer Lofts, [place] 05/12/21 [phone]

## 2021-08-27 ENCOUNTER — Other Ambulatory Visit: Payer: Self-pay

## 2021-08-27 ENCOUNTER — Emergency Department (INDEPENDENT_AMBULATORY_CARE_PROVIDER_SITE_OTHER)
Admission: EM | Admit: 2021-08-27 | Discharge: 2021-08-27 | Disposition: A | Discharge status: Home / Self Care | Payer: BC Managed Care – PPO | Source: Home / Self Care

## 2021-08-27 DIAGNOSIS — N907 Vulvar cyst: Secondary | ICD-10-CM

## 2021-08-27 MED ORDER — SULFAMETHOXAZOLE-TRIMETHOPRIM 800-160 MG PO TABS: 1.0000 | ORAL_TABLET | Freq: Two times a day (BID) | ORAL | 0 refills | Status: DC

## 2021-08-27 NOTE — ED Provider Notes (Signed)
[name]    CSN: [id number] Arrival date & time: 08/27/21  1514      History   Chief Complaint Chief Complaint  Patient presents with   Cyst    Groin area    HPI Mariah Curtis is a 46 y.o. female.   HPI 46 year old female presents with right-sided cyst of groin area.  Patient reports history of cyst in similar area.  Patient reports affected area of groin began 5 days ago.  Past Medical History:  Diagnosis Date   Interstitial cystitis    Seasonal allergies     There are no problems to display for this patient.   History reviewed. No pertinent surgical history.  OB History   No obstetric history on file.      Home Medications    Prior to Admission medications   Medication Sig Start Date End Date Taking? Authorizing Provider  sulfamethoxazole-trimethoprim (BACTRIM DS) 800-160 MG tablet Take 1 tablet by mouth 2 (two) times daily for 7 days. 08/27/21 09/03/21 Yes Eliezer Lofts, FNP  azithromycin (ZITHROMAX) 250 MG tablet Take 1 tablet (250 mg total) by mouth daily. Take first 2 tablets together, then 1 every day until finished. 05/12/21   Eliezer Lofts, FNP  UNKNOWN TO PATIENT     [provider]    Family History Family History  Problem Relation Age of Onset   Heart attack Mother    Heart disease Mother    Heart failure Father    Heart disease Father     Social History Social History   Tobacco Use   Smoking status: Never   Smokeless tobacco: Never  Vaping Use   Vaping Use: Never used  Substance Use Topics   Alcohol use: No   Drug use: No     Allergies   Amoxicillin-pot clavulanate   Review of Systems Review of Systems  Skin:  Positive for rash.  All other systems reviewed and are negative.   Physical Exam Triage Vital Signs ED Triage Vitals  Enc Vitals Group     BP 08/27/21 1532 (!) 143/87     Pulse Rate 08/27/21 1532 78     Resp 08/27/21 1532 17     Temp 08/27/21 1532 98.4 F (36.9 C)     Temp Source 08/27/21  1532 Oral     SpO2 08/27/21 1532 100 %     Weight --      Height --      Head Circumference --      Peak Flow --      Pain Score 08/27/21 1533 6     Pain Loc --      Pain Edu? --      Excl. in [place]? --    No data found.  Updated Vital Signs BP (!) 143/87 (BP Location: Right Arm)   Pulse 78   Temp 98.4 F (36.9 C) (Oral)   Resp 17   LMP  (LMP Unknown)   SpO2 100%       Physical Exam Vitals and nursing note reviewed. Exam conducted with a chaperone present.  Constitutional:      Appearance: Normal appearance. She is normal weight.  HENT:     Head: Normocephalic and atraumatic.     Mouth/Throat:     Mouth: Mucous membranes are moist.     Pharynx: Oropharynx is clear.  Eyes:     Extraocular Movements: Extraocular movements intact.     Conjunctiva/sclera: Conjunctivae normal.     Pupils: Pupils are  equal, round, and reactive to light.  Cardiovascular:     Rate and Rhythm: Normal rate and regular rhythm.     Pulses: Normal pulses.     Heart sounds: Normal heart sounds.  Pulmonary:     Effort: Pulmonary effort is normal.     Breath sounds: Normal breath sounds. No wheezing, rhonchi or rales.  Genitourinary:    Comments: Right labia majora: tiny ~0.5 cm circular shaped sebaceous cyst noted, non-erythematous, non-indurated, non-fluctuant, no lymphatic streaking Musculoskeletal:     Cervical back: Normal range of motion and neck supple.  Skin:    General: Skin is warm and dry.  Neurological:     General: No focal deficit present.     Mental Status: She is alert and oriented to person, place, and time.     UC Treatments / Results  Labs (all labs ordered are listed, but only abnormal results are displayed) Labs Reviewed - No data to display  EKG   Radiology No results found.  Procedures Procedures (including critical care time)  Medications Ordered in UC Medications - No data to display  Initial Impression / Assessment and Plan / UC Course  I have reviewed  the triage vital signs and the nursing notes.  Pertinent labs & imaging results that were available during my care of the patient were reviewed by me and considered in my medical decision making (see chart for details).     MDM: 1.  Epidermoid cyst of labia majora-Rx'd Bactrim. Instructed patient to take medication as directed with food to completion.  Encouraged patient to increase daily water intake while taking this medication.  Advised patient tends worsen and/or unresolved please follow-up with PCP/dermatology or here for further evaluation.  Patient discharged home, hemodynamically stable. Final Clinical Impressions(s) / UC Diagnoses   Final diagnoses:  Epidermoid cyst of labia majora     Discharge Instructions      Instructed patient to take medication as directed with food to completion.  Encouraged patient to increase daily water intake while taking this medication.  Advised patient if symptoms worsen and/or unresolved please follow-up with PCP/dermatology or here for further evaluation.     ED Prescriptions     Medication Sig Dispense Auth. Provider   sulfamethoxazole-trimethoprim (BACTRIM DS) 800-160 MG tablet Take 1 tablet by mouth 2 (two) times daily for 7 days. 14 tablet Eliezer Lofts, FNP      PDMP not reviewed this encounter.   Eliezer Lofts, [place] 08/27/21 1626

## 2021-08-27 NOTE — Discharge Instructions (Addendum)
Instructed patient to take medication as directed with food to completion.  Encouraged patient to increase daily water intake while taking this medication.  Advised patient if symptoms worsen and/or unresolved please follow-up with PCP/dermatology or here for further evaluation.

## 2021-08-27 NOTE — ED Triage Notes (Signed)
Pt c/o cyst on RT side of groin area. Hx of cysts in similar area. Started Monday, worsening in last 24 hours. Low grade fever this am. Pain 6/10

## 2021-08-29 ENCOUNTER — Telehealth: Payer: Self-pay

## 2021-08-29 MED ORDER — DOXYCYCLINE HYCLATE 100 MG PO CAPS: 100.0000 mg | ORAL_CAPSULE | Freq: Two times a day (BID) | ORAL | 0 refills | Status: DC

## 2021-08-29 NOTE — Telephone Encounter (Signed)
Patient desires change in antibiotic.  Doxycycline is sent.  Follow-up with GYN if this fails to improve

## 2021-08-29 NOTE — Telephone Encounter (Signed)
Pt called again re med change. F/u with Dr Meda Coffee. Doxy sent in. Pt notified. If med doesn't resolve problem, pls f/u with GYN for further eval.

## 2021-09-19 LAB — COLOGUARD: COLOGUARD: NEGATIVE

## 2022-02-15 ENCOUNTER — Ambulatory Visit
Admission: EM | Admit: 2022-02-15 | Discharge: 2022-02-15 | Disposition: A | Discharge status: Home / Self Care | Payer: BC Managed Care – PPO | Attending: Family Medicine | Admitting: Family Medicine

## 2022-02-15 ENCOUNTER — Encounter: Payer: Self-pay | Admitting: Emergency Medicine

## 2022-02-15 DIAGNOSIS — J029 Acute pharyngitis, unspecified: Secondary | ICD-10-CM

## 2022-02-15 LAB — POCT RAPID STREP A (OFFICE): Rapid Strep A Screen: NEGATIVE

## 2022-02-15 MED ORDER — AZITHROMYCIN 250 MG PO TABS: 250.0000 mg | ORAL_TABLET | Freq: Every day | ORAL | 0 refills | Status: DC

## 2022-02-15 NOTE — ED Provider Notes (Signed)
[facility]    CSN: [id number] Arrival date & time: 02/15/22  1855      History   Chief Complaint Chief Complaint  Patient presents with   Sore Throat    HPI Mariah Curtis is a 46 y.o. female.   HPI Very pleasant 46 year old female presents with possible strep throat.  Patient has been running fever for several weeks and has had several tests performed by her PCP.  Patient was referred to allergist today where her temperature was 100.8.  Patient requests strep test this evening.  PMH significant for interstitial cystitis and seasonal allergies.  Past Medical History:  Diagnosis Date   Interstitial cystitis    Seasonal allergies     There are no problems to display for this patient.   History reviewed. No pertinent surgical history.  OB History   No obstetric history on file.      Home Medications    Prior to Admission medications   Medication Sig Start Date End Date Taking? Authorizing Provider  azithromycin (ZITHROMAX) 250 MG tablet Take 1 tablet (250 mg total) by mouth daily. Take first 2 tablets together, then 1 every day until finished. 02/15/22  Yes Eliezer Lofts, FNP  UNKNOWN TO PATIENT     [provider]    Family History Family History  Problem Relation Age of Onset   Heart attack Mother    Heart disease Mother    Heart failure Father    Heart disease Father     Social History Social History   Tobacco Use   Smoking status: Never   Smokeless tobacco: Never  Vaping Use   Vaping Use: Never used  Substance Use Topics   Alcohol use: No   Drug use: No     Allergies   Amoxicillin-pot clavulanate and Sulfamethoxazole-trimethoprim   Review of Systems Review of Systems  Constitutional:  Positive for fever.  HENT:  Positive for sore throat.   All other systems reviewed and are negative.    Physical Exam Triage Vital Signs ED Triage Vitals  Enc Vitals Group     BP      Pulse      Resp      Temp      Temp src       SpO2      Weight      Height      Head Circumference      Peak Flow      Pain Score      Pain Loc      Pain Edu?      Excl. in [place]?    No data found.  Updated Vital Signs BP 133/80 (BP Location: Right Arm)   Pulse 63   Temp 98.6 F (37 C) (Oral)   Resp 18   Ht 5' 1.5" (1.562 m)   Wt 120 lb (54.4 kg)   LMP 02/08/2022   SpO2 100%   BMI 22.31 kg/m      Physical Exam Vitals and nursing note reviewed.  Constitutional:      General: She is not in acute distress.    Appearance: Normal appearance. She is well-developed and normal weight. She is not ill-appearing.  HENT:     Head: Normocephalic and atraumatic.     Right Ear: Tympanic membrane, ear canal and external ear normal.     Left Ear: Tympanic membrane, ear canal and external ear normal.     Mouth/Throat:     Mouth: Mucous membranes are  moist.     Pharynx: Oropharynx is clear. Uvula midline. Posterior oropharyngeal erythema and uvula swelling present.  Eyes:     Conjunctiva/sclera: Conjunctivae normal.     Pupils: Pupils are equal, round, and reactive to light.  Cardiovascular:     Rate and Rhythm: Normal rate and regular rhythm.     Pulses: Normal pulses.     Heart sounds: Normal heart sounds. No murmur heard. Pulmonary:     Effort: Pulmonary effort is normal.     Breath sounds: Normal breath sounds. No wheezing, rhonchi or rales.  Musculoskeletal:        General: Normal range of motion.     Cervical back: Normal range of motion and neck supple.  Lymphadenopathy:     Cervical: No cervical adenopathy.  Skin:    General: Skin is warm and dry.  Neurological:     General: No focal deficit present.     Mental Status: She is alert and oriented to person, place, and time.      UC Treatments / Results  Labs (all labs ordered are listed, but only abnormal results are displayed) Labs Reviewed  POCT RAPID STREP A (OFFICE)    EKG   Radiology No results found.  Procedures Procedures (including critical  care time)  Medications Ordered in UC Medications - No data to display  Initial Impression / Assessment and Plan / UC Course  I have reviewed the triage vital signs and the nursing notes.  Pertinent labs & imaging results that were available during my care of the patient were reviewed by me and considered in my medical decision making (see chart for details).     MDM: 1.  Pharyngitis, unspecified etiology-Rx'd Zithromax. Advised patient to take medication as directed with food to completion.  Encouraged patient to increase daily water intake while taking this medication.  Advised patient if symptoms worsen and/or unresolved please follow-up with PCP or here for further evaluation.  Patient discharged home, hemodynamically stable.  Final Clinical Impressions(s) / UC Diagnoses   Final diagnoses:  Pharyngitis, unspecified etiology     Discharge Instructions      Advised patient to take medication as directed with food to completion.  Encouraged patient to increase daily water intake while taking this medication.  Advised patient if symptoms worsen and/or unresolved please follow-up with PCP or here for further evaluation.     ED Prescriptions     Medication Sig Dispense Auth. Provider   azithromycin (ZITHROMAX) 250 MG tablet Take 1 tablet (250 mg total) by mouth daily. Take first 2 tablets together, then 1 every day until finished. 6 tablet Trevor Iha, FNP      PDMP not reviewed this encounter.   Trevor Iha, FNP 02/15/22 1938

## 2022-02-15 NOTE — Discharge Instructions (Addendum)
Advised patient to take medication as directed with food to completion.  Encouraged patient to increase daily water intake while taking this medication.  Advised patient if symptoms worsen and/or unresolved please follow-up with PCP or here for further evaluation. 

## 2022-02-15 NOTE — ED Triage Notes (Signed)
Patient c/o sore throat, possible strep throat.  Patient has been running a fever for several weeks and has had several tests through her PCP.  She was then referred to her allergist today where her temp was 100.8.  Advised she needed a strep test done.  Patient has taken Motrin in the past.

## 2022-02-16 ENCOUNTER — Telehealth: Payer: Self-pay

## 2022-02-16 NOTE — Telephone Encounter (Signed)
LMTRC if any questions or concerns. 

## 2022-03-04 ENCOUNTER — Encounter: Payer: Self-pay | Admitting: Emergency Medicine

## 2022-03-04 ENCOUNTER — Ambulatory Visit (INDEPENDENT_AMBULATORY_CARE_PROVIDER_SITE_OTHER): Payer: BC Managed Care – PPO

## 2022-03-04 ENCOUNTER — Ambulatory Visit
Admission: EM | Admit: 2022-03-04 | Discharge: 2022-03-04 | Disposition: A | Discharge status: Home / Self Care | Payer: BC Managed Care – PPO | Attending: Family Medicine | Admitting: Family Medicine

## 2022-03-04 DIAGNOSIS — R509 Fever, unspecified: Secondary | ICD-10-CM | POA: Diagnosis not present

## 2022-03-04 DIAGNOSIS — J988 Other specified respiratory disorders: Secondary | ICD-10-CM | POA: Diagnosis not present

## 2022-03-04 DIAGNOSIS — Z9109 Other allergy status, other than to drugs and biological substances: Secondary | ICD-10-CM | POA: Diagnosis not present

## 2022-03-04 DIAGNOSIS — B9789 Other viral agents as the cause of diseases classified elsewhere: Secondary | ICD-10-CM

## 2022-03-04 DIAGNOSIS — R0989 Other specified symptoms and signs involving the circulatory and respiratory systems: Secondary | ICD-10-CM | POA: Diagnosis not present

## 2022-03-04 DIAGNOSIS — R0602 Shortness of breath: Secondary | ICD-10-CM

## 2022-03-04 MED ORDER — CEFDINIR 300 MG PO CAPS: 300.0000 mg | ORAL_CAPSULE | Freq: Two times a day (BID) | ORAL | 0 refills | Status: DC

## 2022-03-04 NOTE — ED Provider Notes (Signed)
[facility]    CSN: [id number] Arrival date & time: 03/04/22  0801      History   Chief Complaint Chief Complaint  Patient presents with   Fever    HPI Mariah Curtis is a 46 y.o. female.   HPI  Patient has a long history of seasonal allergies and receives allergy shots for years in the past.  She has week symptoms now that she states feels somewhat like allergies but she also feels much more tired and rundown, and feels like she may be harboring some sort of infection.  She states she had fever off and on for 9 weeks with some congestion and mucus in her nasal and sinus passages.  She feels weak and tired.  She is short of breath.  She states that she had another fever this morning to 101.8 and her husband asked her to come in for reevaluation. She has had COVID flu and RSV testing that was negative.  Strep testing was negative. She has had a course of methylprednisolone and a course of azithromycin None of these have given her lasting relief She takes Allegra as needed and she takes Flonase daily She saw her primary care doctor and had extensive blood work for fever of unknown origin.  Her EBV titers showed old infection, otherwise the blood work was all normal She is frustrated as her continued symptoms.  Concern regarding her exercise tolerance and shortness of breath. States that she had COVID in November 2021 and 1 since then.  Does not think these persistent symptoms are from COVID   Past Medical History:  Diagnosis Date   Interstitial cystitis    Seasonal allergies     There are no problems to display for this patient.   History reviewed. No pertinent surgical history.  OB History   No obstetric history on file.      Home Medications    Prior to Admission medications   Medication Sig Start Date End Date Taking? Authorizing Provider  cefdinir (OMNICEF) 300 MG capsule Take 1 capsule (300 mg total) by mouth 2 (two) times daily. 03/04/22  Yes Eustace Moore, MD    Family History Family History  Problem Relation Age of Onset   Heart attack Mother    Heart disease Mother    Heart failure Father    Heart disease Father     Social History Social History   Tobacco Use   Smoking status: Never   Smokeless tobacco: Never  Vaping Use   Vaping Use: Never used  Substance Use Topics   Alcohol use: No   Drug use: No     Allergies   Amoxicillin-pot clavulanate and Sulfamethoxazole-trimethoprim   Review of Systems Review of Systems See HPI  Physical Exam Triage Vital Signs ED Triage Vitals  Enc Vitals Group     BP 03/04/22 0823 133/85     Pulse Rate 03/04/22 0823 77     Resp 03/04/22 0823 16     Temp 03/04/22 0823 99.6 F (37.6 C)     Temp Source 03/04/22 0823 Oral     SpO2 03/04/22 0823 100 %     Weight 03/04/22 0825 120 lb (54.4 kg)     Height 03/04/22 0825 5' 1.5" (1.562 m)     Head Circumference --      Peak Flow --      Pain Score 03/04/22 0824 4     Pain Loc --      Pain Edu? --  Excl. in [place]? --    No data found.  Updated Vital Signs BP 133/85 (BP Location: Right Arm)   Pulse 77   Temp 99.6 F (37.6 C) (Oral)   Resp 16   Ht 5' 1.5" (1.562 m)   Wt 54.4 kg   LMP 02/08/2022 (Exact Date)   SpO2 100%   BMI 22.31 kg/m   Physical Exam Constitutional:      General: She is not in acute distress.    Appearance: Normal appearance. She is well-developed. She is not ill-appearing.  HENT:     Head: Normocephalic and atraumatic.     Right Ear: Tympanic membrane and ear canal normal.     Left Ear: Tympanic membrane and ear canal normal.     Nose: Congestion and rhinorrhea present.     Comments: Clear rhinorrhea    Mouth/Throat:     Mouth: Mucous membranes are moist.     Pharynx: No posterior oropharyngeal erythema.  Eyes:     Conjunctiva/sclera: Conjunctivae normal.     Pupils: Pupils are equal, round, and reactive to light.  Cardiovascular:     Rate and Rhythm: Normal rate and regular rhythm.      Heart sounds: Murmur heard.  Pulmonary:     Effort: Pulmonary effort is normal. No respiratory distress.     Breath sounds: Normal breath sounds.  Abdominal:     General: There is no distension.     Palpations: Abdomen is soft.  Musculoskeletal:        General: Normal range of motion.     Cervical back: Normal range of motion and neck supple.  Skin:    General: Skin is warm and dry.  Neurological:     Mental Status: She is alert.  Psychiatric:        Mood and Affect: Mood normal.        Behavior: Behavior normal.      UC Treatments / Results  Labs (all labs ordered are listed, but only abnormal results are displayed) Labs Reviewed - No data to display  EKG   Radiology DG Chest 2 View  Result Date: 03/04/2022 CLINICAL DATA:  45 year old female with fever, congestion, shortness of breath. 101.8 F. EXAM: CHEST - 2 VIEW COMPARISON:  Chest radiographs 02/28/2020 and earlier. FINDINGS: Lung volumes and mediastinal contours are within normal limits. Visualized tracheal air column is within normal limits. Lung markings are stable and within normal limits. Both lungs appear clear. No pneumothorax or pleural effusion. No acute osseous abnormality identified. Negative visible bowel gas. IMPRESSION: Negative.  No acute cardiopulmonary abnormality. Electronically Signed   By: Odessa Fleming M.D.   On: 03/04/2022 09:10    Procedures Procedures (including critical care time)  Medications Ordered in UC Medications - No data to display  Initial Impression / Assessment and Plan / UC Course  I have reviewed the triage vital signs and the nursing notes.  Pertinent labs & imaging results that were available during my care of the patient were reviewed by me and considered in my medical decision making (see chart for details).     Because of the progressive shortness of breath, coughing and congestion although mild, I am going to get a chest x-ray.  Discussed with patient I did not feel she  had a bacterial infection.  9 weeks of symptoms and intermittent fevers are not consistent with chronic sinusitis.  Patient is frustrated that she does not see the ENT until December.  She states she is tired  of feeling bad and would like to try an antibiotic to see if it makes her feel better.  She expresses allergies to Augmentin and sulfamethoxazole.  With no promise that will make her better, I will give her a course of cefdinir with instructions to follow-up with her primary care and ENT Final Clinical Impressions(s) / UC Diagnoses   Final diagnoses:  Febrile illness  Viral respiratory illness  History of environmental allergies     Discharge Instructions      Continue your allergy medicines Which are you are drinking lots of fluids Take the cefdinir antibiotic 2 times a day for 10 days Call for problems See your doctor if this does not offer improvement See ENT next month     ED Prescriptions     Medication Sig Dispense Auth. Provider   cefdinir (OMNICEF) 300 MG capsule Take 1 capsule (300 mg total) by mouth 2 (two) times daily. 20 capsule Eustace Moore, MD      PDMP not reviewed this encounter.   Eustace Moore, MD 03/04/22 1226

## 2022-03-04 NOTE — ED Triage Notes (Signed)
Fever x 9 weeks w/ congestion  At home 101.8 this am  No meds  for temp  [place] at age 46 years  Multiple labs & tests done at PCP and allergists

## 2022-03-04 NOTE — Discharge Instructions (Signed)
Continue your allergy medicines Which are you are drinking lots of fluids Take the cefdinir antibiotic 2 times a day for 10 days Call for problems See your doctor if this does not offer improvement See ENT next month

## 2022-04-28 ENCOUNTER — Ambulatory Visit
Admission: RE | Admit: 2022-04-28 | Discharge: 2022-04-28 | Disposition: A | Discharge status: Home / Self Care | Payer: No Typology Code available for payment source | Source: Ambulatory Visit | Attending: Physician Assistant | Admitting: Physician Assistant

## 2022-04-28 VITALS — BP 119/78 | HR 70 | Temp 98.9°F | Resp 16 | Wt 115.0 lb

## 2022-04-28 DIAGNOSIS — R051 Acute cough: Secondary | ICD-10-CM

## 2022-04-28 MED ORDER — BENZONATATE 100 MG PO CAPS: 100.0000 mg | ORAL_CAPSULE | Freq: Four times a day (QID) | ORAL | 0 refills | Status: DC | PRN

## 2022-04-28 MED ORDER — METHYLPREDNISOLONE SODIUM SUCC 125 MG IJ SOLR
125.0000 mg | Freq: Once | INTRAMUSCULAR | Status: AC
  Administered 2022-04-28: 125 mg via INTRAMUSCULAR

## 2022-04-28 NOTE — ED Notes (Signed)
Solumedrol given  while pt lying down- pt states she has history of passing out w/ injections & blood draws

## 2022-04-28 NOTE — ED Triage Notes (Signed)
Per pt, "Have had a cough since Christmas that won't go away and now have ear pressure building." Had COVID at Christmas  Mucinex OTC  for cough - min help  Cough is worse at night

## 2022-04-28 NOTE — ED Provider Notes (Signed)
[name]    CSN: [id number] Arrival date & time: 04/28/22  0806      History   Chief Complaint Chief Complaint  Patient presents with   Cough    HPI Mariah Curtis is a 47 y.o. female.   Patient reports that she had COVID over the Christmas holidays patient states that she still has a cough she has been taking Mucinex without relief she is asking if she could have a shot of steroids in order to help with symptoms.  Patient reports the last time she had COVID this helped her greatly  The history is provided by the patient. No language interpreter was used.  Cough Cough characteristics:  Productive Sputum characteristics:  Nondescript Timing:  Constant Progression:  Unchanged   Past Medical History:  Diagnosis Date   Interstitial cystitis    Seasonal allergies     There are no problems to display for this patient.   History reviewed. No pertinent surgical history.  OB History   No obstetric history on file.      Home Medications    Prior to Admission medications   Medication Sig Start Date End Date Taking? Authorizing Provider  cefdinir (OMNICEF) 300 MG capsule Take 1 capsule (300 mg total) by mouth 2 (two) times daily. Patient not taking: Reported on 04/28/2022 03/04/22   Raylene Everts, MD    Family History Family History  Problem Relation Age of Onset   Heart attack Mother    Heart disease Mother    Heart failure Father    Heart disease Father     Social History Social History   Tobacco Use   Smoking status: Never   Smokeless tobacco: Never  Vaping Use   Vaping Use: Never used  Substance Use Topics   Alcohol use: No   Drug use: No     Allergies   Amoxicillin-pot clavulanate and Sulfamethoxazole-trimethoprim   Review of Systems Review of Systems  Respiratory:  Positive for cough.   All other systems reviewed and are negative.    Physical Exam Triage Vital Signs ED Triage Vitals  Enc Vitals Group     BP 04/28/22  0853 119/78     Pulse Rate 04/28/22 0853 70     Resp 04/28/22 0853 16     Temp 04/28/22 0853 98.9 F (37.2 C)     Temp Source 04/28/22 0853 Oral     SpO2 04/28/22 0853 100 %     Weight 04/28/22 0854 115 lb (52.2 kg)     Height --      Head Circumference --      Peak Flow --      Pain Score 04/28/22 0853 2     Pain Loc --      Pain Edu? --      Excl. in [place]? --    No data found.  Updated Vital Signs BP 119/78 (BP Location: Left Arm)   Pulse 70   Temp 98.9 F (37.2 C) (Oral)   Resp 16   Wt 52.2 kg   LMP 04/14/2022 (Approximate)   SpO2 100%   BMI 21.38 kg/m   Visual Acuity Right Eye Distance:   Left Eye Distance:   Bilateral Distance:    Right Eye Near:   Left Eye Near:    Bilateral Near:     Physical Exam Vitals and nursing note reviewed.  Constitutional:      Appearance: She is well-developed.  HENT:     Head: Normocephalic.  Cardiovascular:     Rate and Rhythm: Normal rate.  Pulmonary:     Effort: Pulmonary effort is normal.     Breath sounds: Normal breath sounds.  Abdominal:     General: There is no distension.  Musculoskeletal:        General: Normal range of motion.     Cervical back: Normal range of motion.  Skin:    General: Skin is warm.  Neurological:     General: No focal deficit present.     Mental Status: She is alert and oriented to person, place, and time.      UC Treatments / Results  Labs (all labs ordered are listed, but only abnormal results are displayed) Labs Reviewed - No data to display  EKG   Radiology No results found.  Procedures Procedures (including critical care time)  Medications Ordered in UC Medications - No data to display  Initial Impression / Assessment and Plan / UC Course  I have reviewed the triage vital signs and the nursing notes.  Pertinent labs & imaging results that were available during my care of the patient were reviewed by me and considered in my medical decision making (see chart for  details).    MDM: Patient given a prescription for Tessalon Perles.  She is given an injection of Solu-Medrol IM. Final Clinical Impressions(s) / UC Diagnoses   Final diagnoses:  Acute cough   Discharge Instructions   None    ED Prescriptions     Medication Sig Dispense Auth. Provider   benzonatate (TESSALON PERLES) 100 MG capsule Take 1 capsule (100 mg total) by mouth every 6 (six) hours as needed for cough. 30 capsule Fransico Meadow, [place]      PDMP not reviewed this encounter. An After Visit Summary was printed and given to the patient.       Fransico Meadow, [place] 04/28/22 8366

## 2022-11-23 ENCOUNTER — Other Ambulatory Visit: Payer: Self-pay

## 2022-11-23 ENCOUNTER — Ambulatory Visit
Admission: RE | Admit: 2022-11-23 | Discharge: 2022-11-23 | Disposition: A | Discharge status: Home / Self Care | Payer: BC Managed Care – PPO | Source: Ambulatory Visit

## 2022-11-23 VITALS — BP 132/80 | HR 66 | Temp 98.7°F | Resp 16

## 2022-11-23 DIAGNOSIS — H9203 Otalgia, bilateral: Secondary | ICD-10-CM | POA: Diagnosis not present

## 2022-11-23 DIAGNOSIS — H6692 Otitis media, unspecified, left ear: Secondary | ICD-10-CM | POA: Diagnosis not present

## 2022-11-23 MED ORDER — CEFDINIR 300 MG PO CAPS: 300.0000 mg | ORAL_CAPSULE | Freq: Two times a day (BID) | ORAL | 0 refills | Status: AC

## 2022-11-23 MED ORDER — PREDNISONE 10 MG (21) PO TBPK: ORAL_TABLET | Freq: Every day | ORAL | 0 refills | Status: DC

## 2022-11-23 NOTE — Discharge Instructions (Addendum)
Patient to take medication as directed with food to completion.  Advised patient to take Prednisone with first dose of Cefdinir for the next 10 days.  Encouraged increase daily water intake to 64 ounces per day while taking this medication.  Advised if symptoms worsen and/or unresolved please follow-up PCP or here for further evaluation.

## 2022-11-23 NOTE — ED Triage Notes (Signed)
Patient reports being treated for an ear infection a few weeks ago, but never felt like she got over the illness.  Now has had chest congestion and dizziness.  Patient was treated with a z-pac.    No color to phlegm.  Reports her sat is normally 99-100, but noticed her sat was 96

## 2022-11-23 NOTE — ED Provider Notes (Signed)
[facility]    CSN: [id number] Arrival date & time: 11/23/22  1852      History   Chief Complaint Chief Complaint  Patient presents with   Appointment    congestion    HPI Mariah Curtis is a 47 y.o. female.   HPI 47 year old female presents with bilateral ill fullness that is continuing for 3 weeks and not fully resolved.  PMH significant for interstitial cystitis and seasonal allergies.  Patient was evaluated by her ED provider on 11/12/2022 prescribed Astelin and Zithromax for acute eustachian tube dysfunction and recurrent acute suppurative otitis media of both ears  Past Medical History:  Diagnosis Date   Interstitial cystitis    Seasonal allergies     There are no problems to display for this patient.   History reviewed. No pertinent surgical history.  OB History   No obstetric history on file.      Home Medications    Prior to Admission medications   Medication Sig Start Date End Date Taking? Authorizing Provider  fexofenadine (ALLEGRA) 60 MG tablet Take 60 mg by mouth 2 (two) times daily.   Yes [provider]  cefdinir (OMNICEF) 300 MG capsule Take 1 capsule (300 mg total) by mouth 2 (two) times daily for 7 days. 11/23/22 11/30/22 Yes Trevor Iha, FNP  predniSONE (STERAPRED UNI-PAK 21 TAB) 10 MG (21) TBPK tablet Take by mouth daily. Take 6 tabs by mouth daily  for 2 days, then 5 tabs for 2 days, then 4 tabs for 2 days, then 3 tabs for 2 days, 2 tabs for 2 days, then 1 tab by mouth daily for 2 days 11/23/22  Yes Trevor Iha, FNP    Family History Family History  Problem Relation Age of Onset   Heart attack Mother    Heart disease Mother    Heart failure Father    Heart disease Father     Social History Social History   Tobacco Use   Smoking status: Never   Smokeless tobacco: Never  Vaping Use   Vaping status: Never Used  Substance Use Topics   Alcohol use: No   Drug use: No     Allergies   Amoxicillin-pot clavulanate  and Sulfamethoxazole-trimethoprim   Review of Systems Review of Systems  HENT:  Positive for congestion and ear pain.   Respiratory:  Positive for cough.      Physical Exam Triage Vital Signs ED Triage Vitals  Encounter Vitals Group     BP      Systolic BP Percentile      Diastolic BP Percentile      Pulse      Resp      Temp      Temp src      SpO2      Weight      Height      Head Circumference      Peak Flow      Pain Score      Pain Loc      Pain Education      Exclude from Growth Chart    No data found.  Updated Vital Signs BP 132/80 (BP Location: Right Arm)   Pulse 66   Temp 98.7 F (37.1 C) (Oral)   Resp 16   LMP 11/09/2022   SpO2 98%   Physical Exam Vitals and nursing note reviewed.  Constitutional:      Appearance: Normal appearance. She is normal weight.  HENT:  Head: Normocephalic and atraumatic.     Right Ear: Tympanic membrane and external ear normal.     Left Ear: External ear normal.     Ears:     Comments: Left TM: Erythematous, red rimmed, retracted; moderate eustachian tube dysfunction noted bilaterally    Mouth/Throat:     Mouth: Mucous membranes are moist.     Pharynx: Oropharynx is clear.  Eyes:     Extraocular Movements: Extraocular movements intact.     Conjunctiva/sclera: Conjunctivae normal.     Pupils: Pupils are equal, round, and reactive to light.  Cardiovascular:     Rate and Rhythm: Normal rate and regular rhythm.     Pulses: Normal pulses.     Heart sounds: Normal heart sounds.  Pulmonary:     Effort: Pulmonary effort is normal.     Breath sounds: Normal breath sounds. No wheezing, rhonchi or rales.  Musculoskeletal:        General: Normal range of motion.     Cervical back: Normal range of motion and neck supple.  Skin:    General: Skin is warm and dry.  Neurological:     General: No focal deficit present.     Mental Status: She is alert and oriented to person, place, and time. Mental status is at baseline.   Psychiatric:        Mood and Affect: Mood normal.        Behavior: Behavior normal.      UC Treatments / Results  Labs (all labs ordered are listed, but only abnormal results are displayed) Labs Reviewed - No data to display  EKG   Radiology No results found.  Procedures Procedures (including critical care time)  Medications Ordered in UC Medications - No data to display  Initial Impression / Assessment and Plan / UC Course  I have reviewed the triage vital signs and the nursing notes.  Pertinent labs & imaging results that were available during my care of the patient were reviewed by me and considered in my medical decision making (see chart for details).     MDM: 1.  Acute left otitis media-Rx'd Cefdinir 300 mg capsule twice daily x 7 days; 2.  Acute otalgia, bilateral-Rx'd Sterapred Unipak (tapering from 60 mg to 10 mg over 10 days). Patient to take medication as directed with food to completion.  Advised patient to take Prednisone with first dose of Cefdinir for the next 10 days.  Encouraged increase daily water intake to 64 ounces per day while taking this medication.  Advised if symptoms worsen and/or unresolved please follow-up PCP or here for further evaluation. Patient to take medication as directed with food to completion.  Advised patient to take Prednisone with first dose of Cefdinir for the next 10 days.  Encouraged increase daily water intake to 64 ounces per day while taking this medication.  Advised if symptoms worsen and/or unresolved please follow-up PCP or here for further evaluation.  Final Clinical Impressions(s) / UC Diagnoses   Final diagnoses:  Acute left otitis media  Acute otalgia, bilateral     Discharge Instructions      Patient to take medication as directed with food to completion.  Advised patient to take Prednisone with first dose of Cefdinir for the next 10 days.  Encouraged increase daily water intake to 64 ounces per day while taking  this medication.  Advised if symptoms worsen and/or unresolved please follow-up PCP or here for further evaluation.     ED Prescriptions  Medication Sig Dispense Auth. Provider   predniSONE (STERAPRED UNI-PAK 21 TAB) 10 MG (21) TBPK tablet Take by mouth daily. Take 6 tabs by mouth daily  for 2 days, then 5 tabs for 2 days, then 4 tabs for 2 days, then 3 tabs for 2 days, 2 tabs for 2 days, then 1 tab by mouth daily for 2 days 42 tablet Trevor Iha, FNP   cefdinir (OMNICEF) 300 MG capsule Take 1 capsule (300 mg total) by mouth 2 (two) times daily for 7 days. 14 capsule Trevor Iha, FNP      PDMP not reviewed this encounter.   Trevor Iha, FNP 11/23/22 1944

## 2022-11-24 ENCOUNTER — Telehealth: Payer: Self-pay

## 2022-11-24 MED ORDER — METHYLPREDNISOLONE 4 MG PO TBPK: ORAL_TABLET | ORAL | 0 refills | Status: DC

## 2022-11-24 NOTE — Telephone Encounter (Signed)
Pharmacy notified to not fill prednisone rx. Rx for methylprednisolone sent to pharmacy

## 2022-11-24 NOTE — Telephone Encounter (Signed)
Patient requests prednisone be switched to methylprednisolone. Rx written for 4mg  dose pack (#21)

## 2022-11-24 NOTE — Telephone Encounter (Signed)
Patient called to have rx for prednisone changed

## 2023-03-06 ENCOUNTER — Ambulatory Visit
Admission: RE | Admit: 2023-03-06 | Discharge: 2023-03-06 | Disposition: A | Discharge status: Home / Self Care | Payer: BC Managed Care – PPO | Source: Ambulatory Visit

## 2023-03-06 ENCOUNTER — Other Ambulatory Visit: Payer: Self-pay

## 2023-03-06 VITALS — BP 131/87 | HR 68 | Temp 98.3°F | Resp 16

## 2023-03-06 DIAGNOSIS — J0100 Acute maxillary sinusitis, unspecified: Secondary | ICD-10-CM

## 2023-03-06 DIAGNOSIS — J3489 Other specified disorders of nose and nasal sinuses: Secondary | ICD-10-CM

## 2023-03-06 MED ORDER — METHYLPREDNISOLONE 4 MG PO TBPK: ORAL_TABLET | ORAL | 0 refills | Status: DC

## 2023-03-06 MED ORDER — DOXYCYCLINE HYCLATE 100 MG PO CAPS: 100.0000 mg | ORAL_CAPSULE | Freq: Two times a day (BID) | ORAL | 0 refills | Status: AC

## 2023-03-06 NOTE — ED Provider Notes (Signed)
[facility]    CSN: [id number] Arrival date & time: 03/06/23  0800      History   Chief Complaint Chief Complaint  Patient presents with   Ear Fullness    Been feeling sick for a little over two weeks. Ear pain and feeling achy. - Entered by patient    HPI Aneisha Lovern is a 47 y.o. female.   HPI Pleasant 47 year old female presents with bilateral ear fullness, sinus congestion/sinus pressure for 2 weeks.  Patient reports has been evaluated by ENT who reports her ears are perfectly fine.  PMH is significant for interstitial cystitis and seasonal allergies.  Past Medical History:  Diagnosis Date   Interstitial cystitis    Seasonal allergies     There are no problems to display for this patient.   History reviewed. No pertinent surgical history.  OB History   No obstetric history on file.      Home Medications    Prior to Admission medications   Medication Sig Start Date End Date Taking? Authorizing Provider  cholecalciferol (VITAMIN D3) 25 MCG (1000 UNIT) tablet Take 1,000 Units by mouth daily.   Yes [provider]  doxycycline (VIBRAMYCIN) 100 MG capsule Take 1 capsule (100 mg total) by mouth 2 (two) times daily for 7 days. 03/06/23 03/13/23 Yes Trevor Iha, FNP  methylPREDNISolone (MEDROL DOSEPAK) 4 MG TBPK tablet Take as directed. 03/06/23  Yes Trevor Iha, FNP  fexofenadine (ALLEGRA) 60 MG tablet Take 60 mg by mouth 2 (two) times daily.    [provider]    Family History Family History  Problem Relation Age of Onset   Heart attack Mother    Heart disease Mother    Heart failure Father    Heart disease Father     Social History Social History   Tobacco Use   Smoking status: Never   Smokeless tobacco: Never  Vaping Use   Vaping status: Never Used  Substance Use Topics   Alcohol use: No   Drug use: No     Allergies   Amoxicillin-pot clavulanate and Sulfamethoxazole-trimethoprim   Review of Systems Review  of Systems  HENT:  Positive for ear pain, sinus pressure and sore throat. Negative for sinus pain.   All other systems reviewed and are negative.    Physical Exam Triage Vital Signs ED Triage Vitals  Encounter Vitals Group     BP 03/06/23 0815 131/87     Systolic BP Percentile --      Diastolic BP Percentile --      Pulse Rate 03/06/23 0815 68     Resp 03/06/23 0815 16     Temp 03/06/23 0815 98.3 F (36.8 C)     Temp src --      SpO2 03/06/23 0815 100 %     Weight --      Height --      Head Circumference --      Peak Flow --      Pain Score 03/06/23 0816 4     Pain Loc --      Pain Education --      Exclude from Growth Chart --    No data found.  Updated Vital Signs BP 131/87   Pulse 68   Temp 98.3 F (36.8 C)   Resp 16   SpO2 100%       Physical Exam Vitals and nursing note reviewed.  Constitutional:      General: She is not in acute  distress.    Appearance: Normal appearance. She is normal weight. She is not ill-appearing.  HENT:     Head: Normocephalic and atraumatic.     Right Ear: Tympanic membrane and external ear normal.     Left Ear: Tympanic membrane and external ear normal.     Ears:     Comments: Significant eustachian tube dysfunction noted bilaterally    Nose:     Right Turbinates: Enlarged.     Left Turbinates: Enlarged.     Right Sinus: Maxillary sinus tenderness and frontal sinus tenderness present.     Left Sinus: Maxillary sinus tenderness and frontal sinus tenderness present.     Comments: Turbinates are erythematous/edematous    Mouth/Throat:     Mouth: Mucous membranes are moist.     Pharynx: Oropharynx is clear.  Eyes:     Extraocular Movements: Extraocular movements intact.     Conjunctiva/sclera: Conjunctivae normal.     Pupils: Pupils are equal, round, and reactive to light.  Cardiovascular:     Rate and Rhythm: Normal rate and regular rhythm.     Pulses: Normal pulses.     Heart sounds: Normal heart sounds. No murmur  heard. Pulmonary:     Effort: Pulmonary effort is normal.     Breath sounds: Normal breath sounds. No wheezing, rhonchi or rales.  Musculoskeletal:        General: Normal range of motion.     Cervical back: Normal range of motion and neck supple.  Skin:    General: Skin is warm and dry.  Neurological:     General: No focal deficit present.     Mental Status: She is alert and oriented to person, place, and time. Mental status is at baseline.      UC Treatments / Results  Labs (all labs ordered are listed, but only abnormal results are displayed) Labs Reviewed - No data to display  EKG   Radiology No results found.  Procedures Procedures (including critical care time)  Medications Ordered in UC Medications - No data to display  Initial Impression / Assessment and Plan / UC Course  I have reviewed the triage vital signs and the nursing notes.  Pertinent labs & imaging results that were available during my care of the patient were reviewed by me and considered in my medical decision making (see chart for details).     MDM: 1.  Subacute maxillary sinusitis-Rx'd doxycycline 100 mg capsule: Take 1 capsule twice daily x 7 days; 2.  Sinus pressure-Rx'd Medrol Dosepak (take as directed). Advised patient to take medications as directed with food to completion.  Advised patient to take Medrol Dosepak with first dose of doxycycline for the next 5 of 7 days.  Encouraged to increase daily water intake to 64 ounces per day while taking this medication.  Advised if symptoms worsen and/or unresolved please follow-up with PCP or here for further evaluation.  Patient discharged home, hemodynamically stable. Final Clinical Impressions(s) / UC Diagnoses   Final diagnoses:  Subacute maxillary sinusitis  Sinus pressure     Discharge Instructions      Advised patient to take medications as directed with food to completion.  Advised patient to take Medrol Dosepak with first dose of  doxycycline for the next 5 of 7 days.  Encouraged to increase daily water intake to 64 ounces per day while taking this medication.  Advised if symptoms worsen and/or unresolved please follow-up with PCP or here for further evaluation.  ED Prescriptions     Medication Sig Dispense Auth. Provider   doxycycline (VIBRAMYCIN) 100 MG capsule Take 1 capsule (100 mg total) by mouth 2 (two) times daily for 7 days. 14 capsule Trevor Iha, FNP   methylPREDNISolone (MEDROL DOSEPAK) 4 MG TBPK tablet Take as directed. 1 each Trevor Iha, FNP      PDMP not reviewed this encounter.   Trevor Iha, FNP 03/06/23 [phone]

## 2023-03-06 NOTE — Discharge Instructions (Addendum)
Advised patient to take medications as directed with food to completion.  Advised patient to take Medrol Dosepak with first dose of doxycycline for the next 5 of 7 days.  Encouraged to increase daily water intake to 64 ounces per day while taking this medication.  Advised if symptoms worsen and/or unresolved please follow-up with PCP or here for further evaluation.

## 2023-03-06 NOTE — ED Triage Notes (Signed)
C/o bil ear pain R > L, scratchy throat x 2 weeks. Temp to 99.2 per patient report.

## 2023-04-03 ENCOUNTER — Ambulatory Visit
Admission: RE | Admit: 2023-04-03 | Discharge: 2023-04-03 | Disposition: A | Discharge status: Home / Self Care | Payer: BC Managed Care – PPO | Source: Ambulatory Visit | Attending: Family Medicine | Admitting: Family Medicine

## 2023-04-03 VITALS — BP 130/81 | HR 70 | Temp 98.6°F | Resp 16

## 2023-04-03 DIAGNOSIS — J309 Allergic rhinitis, unspecified: Secondary | ICD-10-CM | POA: Diagnosis not present

## 2023-04-03 DIAGNOSIS — R059 Cough, unspecified: Secondary | ICD-10-CM | POA: Diagnosis not present

## 2023-04-03 DIAGNOSIS — J0101 Acute recurrent maxillary sinusitis: Secondary | ICD-10-CM

## 2023-04-03 MED ORDER — METHYLPREDNISOLONE 4 MG PO TBPK: ORAL_TABLET | ORAL | 0 refills | Status: DC

## 2023-04-03 MED ORDER — BENZONATATE 200 MG PO CAPS: 200.0000 mg | ORAL_CAPSULE | Freq: Three times a day (TID) | ORAL | 0 refills | Status: AC | PRN

## 2023-04-03 MED ORDER — FEXOFENADINE HCL 180 MG PO TABS: 180.0000 mg | ORAL_TABLET | Freq: Every day | ORAL | 0 refills | Status: AC

## 2023-04-03 MED ORDER — DOXYCYCLINE HYCLATE 100 MG PO CAPS: 100.0000 mg | ORAL_CAPSULE | Freq: Two times a day (BID) | ORAL | 0 refills | Status: AC

## 2023-04-03 NOTE — Discharge Instructions (Addendum)
Advised patient to take Medrol Dosepak and Allegra with first dose of doxycycline for the next 5 of 7 days.  Advised may take Tessalon capsules daily or as needed for cough.  Encouraged to increase daily water intake to 64 ounces per day while taking these medications.  Advised patient if symptoms worsen and/or unresolved please follow-up with PCP or here for further evaluation.

## 2023-04-03 NOTE — ED Triage Notes (Signed)
Patient presents to Urgent Care with complaints of ear fullness, sinus congestion, cough since 8-10 days ago. Patient reports her son was sick before 1-2 weeks ago. Taking Synsi mist spray for sinuses.

## 2023-04-03 NOTE — ED Provider Notes (Signed)
[facility]    CSN: [id number] Arrival date & time: 04/03/23  1253      History   Chief Complaint Chief Complaint  Patient presents with   Ear Fullness    Feeling possible sinus infection or ear infection. Congested and ear fullness. - Entered by patient   Facial Pain   Cough    HPI Mariah Curtis is a 47 y.o. female.   HPI 47 year old female presents with ear fullness, sinus congestion and cough for 10 days.  Patient reports sick children for the past week.  PMH significant for interstitial cystitis and seasonal allergies  Past Medical History:  Diagnosis Date   Interstitial cystitis    Seasonal allergies     There are no active problems to display for this patient.   History reviewed. No pertinent surgical history.  OB History   No obstetric history on file.      Home Medications    Prior to Admission medications   Medication Sig Start Date End Date Taking? Authorizing Provider  cholecalciferol (VITAMIN D3) 25 MCG (1000 UNIT) tablet Take 1,000 Units by mouth daily.    [provider]  fexofenadine (ALLEGRA) 60 MG tablet Take 60 mg by mouth 2 (two) times daily.    [provider]  methylPREDNISolone (MEDROL DOSEPAK) 4 MG TBPK tablet Take as directed. 03/06/23   Trevor Iha, FNP    Family History Family History  Problem Relation Age of Onset   Heart attack Mother    Heart disease Mother    Heart failure Father    Heart disease Father     Social History Social History   Tobacco Use   Smoking status: Never   Smokeless tobacco: Never  Vaping Use   Vaping status: Never Used  Substance Use Topics   Alcohol use: No   Drug use: No     Allergies   Amoxicillin-pot clavulanate and Sulfamethoxazole-trimethoprim   Review of Systems Review of Systems  HENT:  Positive for congestion, sinus pressure and sinus pain.   Respiratory:  Positive for cough.   All other systems reviewed and are negative.    Physical  Exam Triage Vital Signs ED Triage Vitals  Encounter Vitals Group     BP 04/03/23 1329 130/81     Systolic BP Percentile --      Diastolic BP Percentile --      Pulse Rate 04/03/23 1329 70     Resp 04/03/23 1329 16     Temp 04/03/23 1329 98.6 F (37 C)     Temp Source 04/03/23 1329 Oral     SpO2 04/03/23 1329 96 %     Weight --      Height --      Head Circumference --      Peak Flow --      Pain Score 04/03/23 1327 0     Pain Loc --      Pain Education --      Exclude from Growth Chart --    No data found.  Updated Vital Signs BP 130/81 (BP Location: Right Arm)   Pulse 70   Temp 98.6 F (37 C) (Oral)   Resp 16   SpO2 96%   Visual Acuity Right Eye Distance:   Left Eye Distance:   Bilateral Distance:    Right Eye Near:   Left Eye Near:    Bilateral Near:     Physical Exam Vitals and nursing note reviewed.  Constitutional:  Appearance: Normal appearance. She is normal weight.  HENT:     Head: Normocephalic and atraumatic.     Right Ear: Tympanic membrane, ear canal and external ear normal.     Left Ear: Tympanic membrane, ear canal and external ear normal.     Nose: Nose normal.     Mouth/Throat:     Mouth: Mucous membranes are moist.     Pharynx: Oropharynx is clear.  Eyes:     Extraocular Movements: Extraocular movements intact.     Conjunctiva/sclera: Conjunctivae normal.     Pupils: Pupils are equal, round, and reactive to light.  Cardiovascular:     Rate and Rhythm: Normal rate and regular rhythm.     Pulses: Normal pulses.     Heart sounds: Normal heart sounds.  Pulmonary:     Effort: Pulmonary effort is normal.     Breath sounds: Normal breath sounds. No wheezing, rhonchi or rales.  Musculoskeletal:        General: Normal range of motion.     Cervical back: Normal range of motion and neck supple.  Skin:    General: Skin is warm and dry.  Neurological:     General: No focal deficit present.     Mental Status: She is alert and oriented to  person, place, and time. Mental status is at baseline.  Psychiatric:        Mood and Affect: Mood normal.        Behavior: Behavior normal.        Thought Content: Thought content normal.     UC Treatments / Results  Labs (all labs ordered are listed, but only abnormal results are displayed) Labs Reviewed - No data to display  EKG   Radiology No results found.  Procedures Procedures (including critical care time)  Medications Ordered in UC Medications - No data to display  Initial Impression / Assessment and Plan / UC Course  I have reviewed the triage vital signs and the nursing notes.  Pertinent labs & imaging results that were available during my care of the patient were reviewed by me and considered in my medical decision making (see chart for details).     MDM: 1.  Acute recurrent maxillary sinusitis-Rx'd doxycycline 100 mg capsule: Take 1 capsule twice daily x 7 days; 2. Cough, unspecified type-Rx'd Medrol Dosepak: Take as directed.  Rx'd Tessalon 200 mg capsule: Take 1 capsule 1 3 times daily, as needed for cough; 3.  Allergic rhinitis, unspecified seasonality, unspecified trigger-Rx Allegra 180 mg tablet: Take 1 tablet daily x 5 days. Advised patient to take Medrol Dosepak and Allegra with first dose of doxycycline for the next 5 of 7 days.  Advised may take Tessalon capsules daily or as needed for cough.  Encouraged to increase daily water intake to 64 ounces per day while taking these medications.  Advised patient if symptoms worsen and/or unresolved please follow-up with PCP or here for further evaluation.  Patient discharged home, hemodynamically stable. Final Clinical Impressions(s) / UC Diagnoses   Final diagnoses:  None   Discharge Instructions   None    ED Prescriptions   None    PDMP not reviewed this encounter.   Trevor Iha, FNP 04/03/23 1442

## 2023-06-06 ENCOUNTER — Ambulatory Visit
Admission: RE | Admit: 2023-06-06 | Discharge: 2023-06-06 | Disposition: A | Discharge status: Home / Self Care | Payer: 59 | Source: Ambulatory Visit | Attending: Family Medicine | Admitting: Family Medicine

## 2023-06-06 VITALS — BP 121/79 | HR 75 | Temp 98.0°F | Resp 16

## 2023-06-06 DIAGNOSIS — H6991 Unspecified Eustachian tube disorder, right ear: Secondary | ICD-10-CM | POA: Diagnosis not present

## 2023-06-06 DIAGNOSIS — J0100 Acute maxillary sinusitis, unspecified: Secondary | ICD-10-CM

## 2023-06-06 MED ORDER — AZITHROMYCIN 250 MG PO TABS: 250.0000 mg | ORAL_TABLET | Freq: Every day | ORAL | 0 refills | Status: DC

## 2023-06-06 MED ORDER — PREDNISONE 50 MG PO TABS: ORAL_TABLET | ORAL | 0 refills | Status: DC

## 2023-06-06 NOTE — ED Provider Notes (Signed)
 [facility]    CSN: [id number] Arrival date & time: 06/06/23  1042      History   Chief Complaint Chief Complaint  Patient presents with   Ear Fullness    Entered by patient   Otalgia    HPI Mariah Curtis is a 48 y.o. female.   HPI 48 year old female presents with right-sided otalgia and headache for 1 week.  Patient has history of sinus infections and interstitial cystitis.  Patient is accompanied by her son who will also be evaluated today.  Past Medical History:  Diagnosis Date   Interstitial cystitis    Seasonal allergies     There are no active problems to display for this patient.   History reviewed. No pertinent surgical history.  OB History   No obstetric history on file.      Home Medications    Prior to Admission medications   Medication Sig Start Date End Date Taking? Authorizing Provider  azithromycin (ZITHROMAX) 250 MG tablet Take 1 tablet (250 mg total) by mouth daily. Take first 2 tablets together, then 1 every day until finished. 06/06/23  Yes Trevor Iha, FNP  predniSONE (DELTASONE) 50 MG tablet Take 1 tab p.o. daily for 5 days. 06/06/23  Yes Trevor Iha, FNP  cholecalciferol (VITAMIN D3) 25 MCG (1000 UNIT) tablet Take 1,000 Units by mouth daily.    [provider]  fexofenadine (ALLEGRA ALLERGY) 180 MG tablet Take 1 tablet (180 mg total) by mouth daily for 15 days. 04/03/23 04/18/23  Trevor Iha, FNP    Family History Family History  Problem Relation Age of Onset   Heart attack Mother    Heart disease Mother    Heart failure Father    Heart disease Father     Social History Social History   Tobacco Use   Smoking status: Never   Smokeless tobacco: Never  Vaping Use   Vaping status: Never Used  Substance Use Topics   Alcohol use: No   Drug use: No     Allergies   Amoxicillin-pot clavulanate and Sulfamethoxazole-trimethoprim   Review of Systems Review of Systems  HENT:  Positive for ear pain.       Physical Exam Triage Vital Signs ED Triage Vitals  Encounter Vitals Group     BP      Systolic BP Percentile      Diastolic BP Percentile      Pulse      Resp      Temp      Temp src      SpO2      Weight      Height      Head Circumference      Peak Flow      Pain Score      Pain Loc      Pain Education      Exclude from Growth Chart    No data found.  Updated Vital Signs BP 121/79   Pulse 75   Temp 98 F (36.7 C)   Resp 16   LMP 05/23/2023   SpO2 98%     Physical Exam Vitals and nursing note reviewed.  Constitutional:      General: She is not in acute distress.    Appearance: Normal appearance. She is normal weight. She is not ill-appearing.  HENT:     Head: Normocephalic and atraumatic.     Right Ear: Tympanic membrane and external ear normal.     Left Ear: Tympanic membrane,  ear canal and external ear normal.     Ears:     Comments: Right EAC-significant eustachian tube dysfunction    Mouth/Throat:     Mouth: Mucous membranes are moist.     Pharynx: Oropharynx is clear.  Eyes:     Extraocular Movements: Extraocular movements intact.     Conjunctiva/sclera: Conjunctivae normal.     Pupils: Pupils are equal, round, and reactive to light.  Cardiovascular:     Rate and Rhythm: Normal rate and regular rhythm.     Pulses: Normal pulses.     Heart sounds: Normal heart sounds.  Pulmonary:     Effort: Pulmonary effort is normal.     Breath sounds: Normal breath sounds. No wheezing, rhonchi or rales.  Musculoskeletal:        General: Normal range of motion.     Cervical back: Normal range of motion and neck supple.  Skin:    General: Skin is warm and dry.  Neurological:     General: No focal deficit present.     Mental Status: She is alert and oriented to person, place, and time. Mental status is at baseline.  Psychiatric:        Mood and Affect: Mood normal.        Behavior: Behavior normal.      UC Treatments / Results  Labs (all labs  ordered are listed, but only abnormal results are displayed) Labs Reviewed - No data to display  EKG   Radiology No results found.  Procedures Procedures (including critical care time)  Medications Ordered in UC Medications - No data to display  Initial Impression / Assessment and Plan / UC Course  I have reviewed the triage vital signs and the nursing notes.  Pertinent labs & imaging results that were available during my care of the patient were reviewed by me and considered in my medical decision making (see chart for details).     MDM: 1.  Subacute maxillary sinusitis-Rx'd Zithromax (500 mg day 1, then 250 mg day 2-5); 2.  Eustachian tube dysfunction, right-Rx'd prednisone 50 mg tablet: Take 1 tablet daily x 5 days. Advised patient to take medications as directed with food to completion.  Advised patient to take prednisone with Zithromax daily for the next 5 days.  Encouraged to increase daily water intake to 64 ounces per day while taking these medications.  Advised if symptoms worsen and/or unresolved please follow-up with your PCP, ENT, or here for further evaluation.  Patient discharged home, hemodynamically stable. Final Clinical Impressions(s) / UC Diagnoses   Final diagnoses:  Subacute maxillary sinusitis  Eustachian tube dysfunction, right     Discharge Instructions      Advised patient to take medications as directed with food to completion.  Advised patient to take prednisone with Zithromax daily for the next 5 days.  Encouraged to increase daily water intake to 64 ounces per day while taking these medications.  Advised if symptoms worsen and/or unresolved please follow-up with your PCP, ENT, or here for further evaluation.     ED Prescriptions     Medication Sig Dispense Auth. Provider   azithromycin (ZITHROMAX) 250 MG tablet Take 1 tablet (250 mg total) by mouth daily. Take first 2 tablets together, then 1 every day until finished. 6 tablet Trevor Iha, FNP    predniSONE (DELTASONE) 50 MG tablet Take 1 tab p.o. daily for 5 days. 5 tablet Trevor Iha, FNP      PDMP not reviewed this encounter.  Trevor Iha, FNP 06/06/23 1151

## 2023-06-06 NOTE — ED Triage Notes (Signed)
 Pt presents to uc with co of right sided otalgia and headache for one week with history of ear infections and Eustachian tube dysfunction

## 2023-06-06 NOTE — Discharge Instructions (Addendum)
 Advised patient to take medications as directed with food to completion.  Advised patient to take prednisone with Zithromax daily for the next 5 days.  Encouraged to increase daily water intake to 64 ounces per day while taking these medications.  Advised if symptoms worsen and/or unresolved please follow-up with your PCP, ENT, or here for further evaluation.

## 2023-08-07 ENCOUNTER — Ambulatory Visit
Admission: EM | Admit: 2023-08-07 | Discharge: 2023-08-07 | Disposition: A | Discharge status: Home / Self Care | Attending: Family Medicine | Admitting: Family Medicine

## 2023-08-07 ENCOUNTER — Encounter: Payer: Self-pay | Admitting: Emergency Medicine

## 2023-08-07 DIAGNOSIS — R0989 Other specified symptoms and signs involving the circulatory and respiratory systems: Secondary | ICD-10-CM

## 2023-08-07 DIAGNOSIS — J069 Acute upper respiratory infection, unspecified: Secondary | ICD-10-CM

## 2023-08-07 MED ORDER — AZITHROMYCIN 250 MG PO TABS: 250.0000 mg | ORAL_TABLET | Freq: Every day | ORAL | 0 refills | Status: DC

## 2023-08-07 MED ORDER — PREDNISONE 50 MG PO TABS: ORAL_TABLET | ORAL | 0 refills | Status: DC

## 2023-08-07 NOTE — ED Provider Notes (Signed)
 [facility]    CSN: [id number] Arrival date & time: 08/07/23  1127      History   Chief Complaint Chief Complaint  Patient presents with   Otalgia    HPI Mariah Curtis is a 48 y.o. female.   HPI 48 year old female presents with a right ear feeling, nasal drainage, difficulty taking deep breath for 1 week.  Patient has taken OTC Allegra  with OTC nasal spray.  PMH significant for interstitial cystitis and seasonal allergies.  Past Medical History:  Diagnosis Date   Interstitial cystitis    Seasonal allergies     There are no active problems to display for this patient.   History reviewed. No pertinent surgical history.  OB History   No obstetric history on file.      Home Medications    Prior to Admission medications   Medication Sig Start Date End Date Taking? Authorizing Provider  azithromycin  (ZITHROMAX ) 250 MG tablet Take 1 tablet (250 mg total) by mouth daily. Take first 2 tablets together, then 1 every day until finished. 08/07/23  Yes Leonides Ramp, FNP  cholecalciferol (VITAMIN D3) 25 MCG (1000 UNIT) tablet Take 1,000 Units by mouth daily.   Yes [provider]  predniSONE  (DELTASONE ) 50 MG tablet Take 1 tab p.o. daily for 5 days. 08/07/23  Yes Leonides Ramp, FNP  fexofenadine  (ALLEGRA  ALLERGY) 180 MG tablet Take 1 tablet (180 mg total) by mouth daily for 15 days. 04/03/23 04/18/23  Leonides Ramp, FNP    Family History Family History  Problem Relation Age of Onset   Heart attack Mother    Heart disease Mother    Heart failure Father    Heart disease Father     Social History Social History   Tobacco Use   Smoking status: Never   Smokeless tobacco: Never  Vaping Use   Vaping status: Never Used  Substance Use Topics   Alcohol use: No   Drug use: No     Allergies   Amoxicillin-pot clavulanate and Sulfamethoxazole -trimethoprim    Review of Systems Review of Systems  HENT:  Positive for congestion and ear pain.    Respiratory:  Positive for cough.   All other systems reviewed and are negative.    Physical Exam Triage Vital Signs ED Triage Vitals  Encounter Vitals Group     BP 08/07/23 1150 138/78     Systolic BP Percentile --      Diastolic BP Percentile --      Pulse Rate 08/07/23 1150 70     Resp 08/07/23 1150 16     Temp 08/07/23 1150 99.5 F (37.5 C)     Temp Source 08/07/23 1150 Oral     SpO2 08/07/23 1150 99 %     Weight 08/07/23 1152 120 lb (54.4 kg)     Height 08/07/23 1152 5\' 2"  (1.575 m)     Head Circumference --      Peak Flow --      Pain Score 08/07/23 1152 0     Pain Loc --      Pain Education --      Exclude from Growth Chart --    No data found.  Updated Vital Signs BP 138/78 (BP Location: Right Arm)   Pulse 70   Temp 99.5 F (37.5 C) (Oral)   Resp 16   Ht 5\' 2"  (1.575 m)   Wt 120 lb (54.4 kg)   LMP 07/10/2023   SpO2 99%   BMI 21.95 kg/m  Visual Acuity Right Eye Distance:   Left Eye Distance:   Bilateral Distance:    Right Eye Near:   Left Eye Near:    Bilateral Near:     Physical Exam Vitals and nursing note reviewed.  Constitutional:      Appearance: Normal appearance. She is normal weight. She is ill-appearing.  HENT:     Head: Normocephalic and atraumatic.     Right Ear: Tympanic membrane, ear canal and external ear normal.     Left Ear: Tympanic membrane, ear canal and external ear normal.     Mouth/Throat:     Mouth: Mucous membranes are moist.  Eyes:     Extraocular Movements: Extraocular movements intact.     Conjunctiva/sclera: Conjunctivae normal.     Pupils: Pupils are equal, round, and reactive to light.  Cardiovascular:     Rate and Rhythm: Normal rate and regular rhythm.     Pulses: Normal pulses.     Heart sounds: Normal heart sounds.  Pulmonary:     Effort: Pulmonary effort is normal. No respiratory distress.     Breath sounds: Normal breath sounds. No stridor. No wheezing, rhonchi or rales.     Comments: Infrequent  nonproductive cough on exam Chest:     Chest wall: No tenderness.  Musculoskeletal:        General: Normal range of motion.     Cervical back: Normal range of motion and neck supple.  Skin:    General: Skin is warm and dry.  Neurological:     General: No focal deficit present.     Mental Status: She is alert and oriented to person, place, and time. Mental status is at baseline.      UC Treatments / Results  Labs (all labs ordered are listed, but only abnormal results are displayed) Labs Reviewed - No data to display  EKG   Radiology No results found.  Procedures Procedures (including critical care time)  Medications Ordered in UC Medications - No data to display  Initial Impression / Assessment and Plan / UC Course  I have reviewed the triage vital signs and the nursing notes.  Pertinent labs & imaging results that were available during my care of the patient were reviewed by me and considered in my medical decision making (see chart for details).     MDM: 1.  Acute URI-Rx'd Zithromax  500 mg day 1, then 250 mg day 2-5; 2.  Chest congestion-Rx'd prednisone  50 mg tablet: Take 1 tablet daily x 5 days. Advised patient to take medications as directed with food to completion.  Advised take prednisone  with Zithromax  daily for next 5 days.  Encouraged to increase daily water intake to 64 ounces per day while taking these medications.  Advised if symptoms worsen and/or unresolved please follow-up with your PCP or here for further evaluation.  Patient discharged home, hemodynamically stable. Final Clinical Impressions(s) / UC Diagnoses   Final diagnoses:  Chest congestion  Acute URI     Discharge Instructions      Advised patient to take medications as directed with food to completion.  Advised take prednisone  with Zithromax  daily for next 5 days.  Encouraged to increase daily water intake to 64 ounces per day while taking these medications.  Advised if symptoms worsen and/or  unresolved please follow-up with your PCP or here for further evaluation.     ED Prescriptions     Medication Sig Dispense Auth. Provider   azithromycin  (ZITHROMAX ) 250 MG tablet Take 1  tablet (250 mg total) by mouth daily. Take first 2 tablets together, then 1 every day until finished. 6 tablet Mandell Pangborn, FNP   predniSONE  (DELTASONE ) 50 MG tablet Take 1 tab p.o. daily for 5 days. 5 tablet Kimani Hovis, FNP      PDMP not reviewed this encounter.   Leonides Ramp, FNP 08/07/23 1339

## 2023-08-07 NOTE — ED Triage Notes (Signed)
 Patient c/o full right ear feeling, nasal drainage, difficulty taking a deep breath x 1 week.  Patient has taken Allegra  and an OTC nasal spray.

## 2023-08-07 NOTE — Discharge Instructions (Addendum)
 Advised patient to take medications as directed with food to completion.  Advised take prednisone  with Zithromax  daily for next 5 days.  Encouraged to increase daily water intake to 64 ounces per day while taking these medications.  Advised if symptoms worsen and/or unresolved please follow-up with your PCP or here for further evaluation.

## 2023-11-25 ENCOUNTER — Ambulatory Visit
Admission: RE | Admit: 2023-11-25 | Discharge: 2023-11-25 | Disposition: A | Discharge status: Home / Self Care | Source: Ambulatory Visit | Attending: Internal Medicine | Admitting: Internal Medicine

## 2023-11-25 ENCOUNTER — Other Ambulatory Visit: Payer: Self-pay

## 2023-11-25 VITALS — BP 143/87 | HR 71 | Temp 98.1°F | Resp 16

## 2023-11-25 DIAGNOSIS — H9203 Otalgia, bilateral: Secondary | ICD-10-CM

## 2023-11-25 DIAGNOSIS — R350 Frequency of micturition: Secondary | ICD-10-CM

## 2023-11-25 LAB — POCT URINALYSIS DIP (MANUAL ENTRY)
Bilirubin, UA: NEGATIVE
Glucose, UA: NEGATIVE mg/dL
Ketones, POC UA: NEGATIVE mg/dL
Leukocytes, UA: NEGATIVE
Nitrite, UA: NEGATIVE
Protein Ur, POC: NEGATIVE mg/dL
Spec Grav, UA: <=1.005 — AB (ref 1.010–1.025)
Urobilinogen, UA: 0.2 U/dL
pH, UA: 6 (ref 5.0–8.0)

## 2023-11-25 MED ORDER — NITROFURANTOIN MONOHYD MACRO 100 MG PO CAPS: 100.0000 mg | ORAL_CAPSULE | Freq: Two times a day (BID) | ORAL | 0 refills | Status: DC

## 2023-11-25 NOTE — Discharge Instructions (Addendum)
 Symptoms, physical exam findings and urinalysis do suggest a urinary tract infection.  We will send the urine off for culture as well to ensure that the antibiotic will be effective.  We will treat with the following: Macrobid  100 mg twice daily for 5 days. This is an antibiotic. Make sure to stay hydrated by drinking plenty of water. Return to urgent care or PCP if symptoms worsen or fail to resolve.   Evaluation of the ears today did not show any signs of infection or fluid.

## 2023-11-25 NOTE — ED Triage Notes (Signed)
 Week before last week has had urinary frequency which has gradually gotten worse. Generalized ill feeling. Ears hurting since last night. No fever. No otc meds.

## 2023-11-25 NOTE — ED Provider Notes (Signed)
 [facility]    CSN: [id number] Arrival date & time: 11/25/23  1330      History   Chief Complaint Chief Complaint  Patient presents with   Urinary Frequency    I think I have a uti or bladder infection. - Entered by patient    HPI Mariah Curtis is a 48 y.o. female.   48 year old female presents urgent care with complaints of frequency, urgency and dysuria.  This started about 10 to 14 days ago.  The symptoms have worsened significantly.  She denies any fevers, abdominal pain, visual hematuria, nausea, vomiting.  She does relate that both of her ears have been painful for the last 1 to 2 days.  She has a history of some bladder infections in the past but no history of kidney infections.   Urinary Frequency Pertinent negatives include no chest pain, no abdominal pain and no shortness of breath.    Past Medical History:  Diagnosis Date   Interstitial cystitis    Seasonal allergies     There are no active problems to display for this patient.   History reviewed. No pertinent surgical history.  OB History   No obstetric history on file.      Home Medications    Prior to Admission medications   Medication Sig Start Date End Date Taking? Authorizing Provider  nitrofurantoin , macrocrystal-monohydrate, (MACROBID ) 100 MG capsule Take 1 capsule (100 mg total) by mouth 2 (two) times daily. 11/25/23  Yes Jaanvi Fizer A, PA-C  azithromycin  (ZITHROMAX ) 250 MG tablet Take 1 tablet (250 mg total) by mouth daily. Take first 2 tablets together, then 1 every day until finished. 08/07/23   Teddy Sharper, FNP  cholecalciferol (VITAMIN D3) 25 MCG (1000 UNIT) tablet Take 1,000 Units by mouth daily.    [provider]  fexofenadine  (ALLEGRA  ALLERGY) 180 MG tablet Take 1 tablet (180 mg total) by mouth daily for 15 days. 04/03/23 04/18/23  Teddy Sharper, FNP  predniSONE  (DELTASONE ) 50 MG tablet Take 1 tab p.o. daily for 5 days. 08/07/23   Teddy Sharper, FNP    Family  History Family History  Problem Relation Age of Onset   Heart attack Mother    Heart disease Mother    Heart failure Father    Heart disease Father     Social History Social History   Tobacco Use   Smoking status: Never   Smokeless tobacco: Never  Vaping Use   Vaping status: Never Used  Substance Use Topics   Alcohol use: No   Drug use: No     Allergies   Amoxicillin-pot clavulanate and Sulfamethoxazole -trimethoprim    Review of Systems Review of Systems  Constitutional:  Negative for chills and fever.  HENT:  Positive for ear pain. Negative for sore throat.   Eyes:  Negative for pain and visual disturbance.  Respiratory:  Negative for cough and shortness of breath.   Cardiovascular:  Negative for chest pain and palpitations.  Gastrointestinal:  Negative for abdominal pain and vomiting.  Genitourinary:  Positive for dysuria and frequency. Negative for hematuria.  Musculoskeletal:  Negative for arthralgias and back pain.  Skin:  Negative for color change and rash.  Neurological:  Negative for seizures and syncope.  All other systems reviewed and are negative.    Physical Exam Triage Vital Signs ED Triage Vitals  Encounter Vitals Group     BP 11/25/23 1338 (!) 143/87     Girls Systolic BP Percentile --      Girls Diastolic  BP Percentile --      Boys Systolic BP Percentile --      Boys Diastolic BP Percentile --      Pulse Rate 11/25/23 1338 71     Resp 11/25/23 1338 16     Temp 11/25/23 1338 98.1 F (36.7 C)     Temp src --      SpO2 11/25/23 1338 99 %     Weight --      Height --      Head Circumference --      Peak Flow --      Pain Score 11/25/23 1340 6     Pain Loc --      Pain Education --      Exclude from Growth Chart --    No data found.  Updated Vital Signs BP (!) 143/87   Pulse 71   Temp 98.1 F (36.7 C)   Resp 16   LMP 11/16/2023 (Exact Date)   SpO2 99%   Visual Acuity Right Eye Distance:   Left Eye Distance:   Bilateral  Distance:    Right Eye Near:   Left Eye Near:    Bilateral Near:     Physical Exam Vitals and nursing note reviewed.  Constitutional:      General: She is not in acute distress.    Appearance: She is well-developed.  HENT:     Head: Normocephalic and atraumatic.     Right Ear: Tympanic membrane normal.     Left Ear: Tympanic membrane normal.  Eyes:     Conjunctiva/sclera: Conjunctivae normal.  Cardiovascular:     Rate and Rhythm: Normal rate and regular rhythm.     Heart sounds: No murmur heard. Pulmonary:     Effort: Pulmonary effort is normal. No respiratory distress.     Breath sounds: Normal breath sounds.  Abdominal:     Palpations: Abdomen is soft.     Tenderness: There is no abdominal tenderness.  Musculoskeletal:        General: No swelling.     Cervical back: Neck supple.  Skin:    General: Skin is warm and dry.     Capillary Refill: Capillary refill takes less than 2 seconds.  Neurological:     Mental Status: She is alert.  Psychiatric:        Mood and Affect: Mood normal.      UC Treatments / Results  Labs (all labs ordered are listed, but only abnormal results are displayed) Labs Reviewed  POCT URINALYSIS DIP (MANUAL ENTRY) - Abnormal; Notable for the following components:      Result Value   Spec Grav, UA <=1.005 (*)    Blood, UA trace-intact (*)    All other components within normal limits    EKG   Radiology No results found.  Procedures Procedures (including critical care time)  Medications Ordered in UC Medications - No data to display  Initial Impression / Assessment and Plan / UC Course  I have reviewed the triage vital signs and the nursing notes.  Pertinent labs & imaging results that were available during my care of the patient were reviewed by me and considered in my medical decision making (see chart for details).     Urinary frequency  Otalgia of both ears   Symptoms, physical exam findings and urinalysis do suggest a  urinary tract infection.  We will send the urine off for culture as well to ensure that the antibiotic will be effective.  We will treat with the following: Macrobid  100 mg twice daily for 5 days. This is an antibiotic. Make sure to stay hydrated by drinking plenty of water. Return to urgent care or PCP if symptoms worsen or fail to resolve.   Evaluation of the ears today did not show any signs of infection or fluid.  Final Clinical Impressions(s) / UC Diagnoses   Final diagnoses:  Urinary frequency  Otalgia of both ears     Discharge Instructions      Symptoms, physical exam findings and urinalysis do suggest a urinary tract infection.  We will send the urine off for culture as well to ensure that the antibiotic will be effective.  We will treat with the following: Macrobid  100 mg twice daily for 5 days. This is an antibiotic. Make sure to stay hydrated by drinking plenty of water. Return to urgent care or PCP if symptoms worsen or fail to resolve.   Evaluation of the ears today did not show any signs of infection or fluid.      ED Prescriptions     Medication Sig Dispense Auth. Provider   nitrofurantoin , macrocrystal-monohydrate, (MACROBID ) 100 MG capsule Take 1 capsule (100 mg total) by mouth 2 (two) times daily. 10 capsule Teresa Almarie LABOR, [place]      PDMP not reviewed this encounter.   Teresa Almarie LABOR, [place] 11/25/23 [phone]

## 2023-11-27 LAB — URINE CULTURE: Culture: NO GROWTH

## 2023-12-02 ENCOUNTER — Ambulatory Visit (HOSPITAL_COMMUNITY): Payer: Self-pay | Admitting: Internal Medicine

## 2023-12-06 ENCOUNTER — Ambulatory Visit
Admission: RE | Admit: 2023-12-06 | Discharge: 2023-12-06 | Disposition: A | Discharge status: Home / Self Care | Source: Ambulatory Visit | Attending: Family Medicine | Admitting: Family Medicine

## 2023-12-06 VITALS — BP 117/78 | HR 71 | Temp 98.4°F | Resp 16

## 2023-12-06 DIAGNOSIS — H6693 Otitis media, unspecified, bilateral: Secondary | ICD-10-CM

## 2023-12-06 MED ORDER — CEFDINIR 300 MG PO CAPS: 300.0000 mg | ORAL_CAPSULE | Freq: Two times a day (BID) | ORAL | 0 refills | Status: AC

## 2023-12-06 NOTE — ED Provider Notes (Signed)
 [facility]    CSN: [id number] Arrival date & time: 12/06/23  1831      History   Chief Complaint Chief Complaint  Patient presents with   Ear Fullness    Possible ear or sinus infection. - Entered by patient    HPI Mariah Curtis is a 48 y.o. female.   HPI Very pleasant 48 year old female presents with right ear pain for 1 week with concern of right ear infection.  Past Medical History:  Diagnosis Date   Interstitial cystitis    Seasonal allergies     There are no active problems to display for this patient.   History reviewed. No pertinent surgical history.  OB History   No obstetric history on file.      Home Medications    Prior to Admission medications   Medication Sig Start Date End Date Taking? Authorizing Provider  cefdinir  (OMNICEF ) 300 MG capsule Take 1 capsule (300 mg total) by mouth 2 (two) times daily for 7 days. 12/06/23 12/13/23 Yes Teddy Sharper, FNP  cholecalciferol (VITAMIN D3) 25 MCG (1000 UNIT) tablet Take 1,000 Units by mouth daily.    [provider]  fexofenadine  (ALLEGRA  ALLERGY) 180 MG tablet Take 1 tablet (180 mg total) by mouth daily for 15 days. 04/03/23 04/18/23  Teddy Sharper, FNP    Family History Family History  Problem Relation Age of Onset   Heart attack Mother    Heart disease Mother    Heart failure Father    Heart disease Father     Social History Social History   Tobacco Use   Smoking status: Never   Smokeless tobacco: Never  Vaping Use   Vaping status: Never Used  Substance Use Topics   Alcohol use: No   Drug use: No     Allergies   Amoxicillin-pot clavulanate and Sulfamethoxazole -trimethoprim    Review of Systems Review of Systems  HENT:  Positive for ear pain.   All other systems reviewed and are negative.    Physical Exam Triage Vital Signs ED Triage Vitals  Encounter Vitals Group     BP 12/06/23 1841 117/78     Girls Systolic BP Percentile --      Girls Diastolic BP  Percentile --      Boys Systolic BP Percentile --      Boys Diastolic BP Percentile --      Pulse Rate 12/06/23 1841 71     Resp 12/06/23 1841 16     Temp 12/06/23 1841 98.4 F (36.9 C)     Temp src --      SpO2 12/06/23 1841 98 %     Weight --      Height --      Head Circumference --      Peak Flow --      Pain Score 12/06/23 1840 8     Pain Loc --      Pain Education --      Exclude from Growth Chart --    No data found.  Updated Vital Signs BP 117/78   Pulse 71   Temp 98.4 F (36.9 C)   Resp 16   LMP 11/16/2023 (Exact Date)   SpO2 98%   Visual Acuity Right Eye Distance:   Left Eye Distance:   Bilateral Distance:    Right Eye Near:   Left Eye Near:    Bilateral Near:     Physical Exam Vitals and nursing note reviewed.  Constitutional:  Appearance: Normal appearance. She is normal weight.  HENT:     Head: Normocephalic and atraumatic.     Right Ear: Ear canal and external ear normal.     Left Ear: Ear canal and external ear normal.     Ears:     Comments: Right TM: Erythematous, bulging; Left TM: Red rimmed, erythematous    Mouth/Throat:     Mouth: Mucous membranes are moist.     Pharynx: Oropharynx is clear.  Eyes:     Extraocular Movements: Extraocular movements intact.     Pupils: Pupils are equal, round, and reactive to light.  Cardiovascular:     Rate and Rhythm: Normal rate and regular rhythm.     Pulses: Normal pulses.     Heart sounds: Normal heart sounds.  Pulmonary:     Effort: Pulmonary effort is normal.     Breath sounds: Normal breath sounds. No wheezing, rhonchi or rales.  Musculoskeletal:        General: Normal range of motion.     Cervical back: Normal range of motion and neck supple.  Skin:    General: Skin is warm and dry.  Neurological:     General: No focal deficit present.     Mental Status: She is alert and oriented to person, place, and time.      UC Treatments / Results  Labs (all labs ordered are listed, but  only abnormal results are displayed) Labs Reviewed - No data to display  EKG   Radiology No results found.  Procedures Procedures (including critical care time)  Medications Ordered in UC Medications - No data to display  Initial Impression / Assessment and Plan / UC Course  I have reviewed the triage vital signs and the nursing notes.  Pertinent labs & imaging results that were available during my care of the patient were reviewed by me and considered in my medical decision making (see chart for details).     MDM: 1.  Acute bilateral otitis media-Rx'd cefdinir  300 mg capsule: Take 1 capsule twice daily x 7 days. Advised patient take medication as directed with food to completion.  Encouraged increase daily water intake while taking this medication.  Advised if symptoms worsen and/or unresolved please follow-up with your PCP, ENT or here for further evaluation.  Patient discharged home, hemodynamically stable. Final Clinical Impressions(s) / UC Diagnoses   Final diagnoses:  Acute bilateral otitis media     Discharge Instructions      Advised patient take medication as directed with food to completion.  Encouraged increase daily water intake while taking this medication.  Advised if symptoms worsen and/or unresolved please follow-up with your PCP, ENT or here for further evaluation.     ED Prescriptions     Medication Sig Dispense Auth. Provider   cefdinir  (OMNICEF ) 300 MG capsule Take 1 capsule (300 mg total) by mouth 2 (two) times daily for 7 days. 14 capsule Denica Web, FNP      PDMP not reviewed this encounter.   Teddy Sharper, FNP 12/06/23 [phone]

## 2023-12-06 NOTE — Discharge Instructions (Addendum)
 Advised patient take medication as directed with food to completion.  Encouraged increase daily water intake while taking this medication.  Advised if symptoms worsen and/or unresolved please follow-up with your PCP, ENT or here for further evaluation.

## 2023-12-06 NOTE — ED Triage Notes (Addendum)
 Pt presents to uc with right otalgia for one week Pt reports she is concerned for ear infection

## 2023-12-07 ENCOUNTER — Telehealth: Payer: Self-pay

## 2023-12-07 NOTE — Telephone Encounter (Signed)
Called to check on patient. Left voicemail.

## 2023-12-13 ENCOUNTER — Telehealth: Admitting: Physician Assistant

## 2023-12-13 DIAGNOSIS — H6503 Acute serous otitis media, bilateral: Secondary | ICD-10-CM | POA: Diagnosis not present

## 2023-12-13 DIAGNOSIS — H6993 Unspecified Eustachian tube disorder, bilateral: Secondary | ICD-10-CM

## 2023-12-13 MED ORDER — CIPROFLOXACIN-DEXAMETHASONE 0.3-0.1 % OT SUSP: 4.0000 [drp] | Freq: Two times a day (BID) | OTIC | 0 refills | Status: AC

## 2023-12-13 MED ORDER — AZITHROMYCIN 250 MG PO TABS: ORAL_TABLET | ORAL | 0 refills | Status: AC

## 2023-12-13 MED ORDER — FLUTICASONE PROPIONATE 50 MCG/ACT NA SUSP: 2.0000 | Freq: Every day | NASAL | 0 refills | Status: AC

## 2023-12-13 NOTE — Progress Notes (Signed)
 E-Visit for Ear Pain - Acute Otitis Media   We are sorry that you are not feeling well. Here is how we plan to help!  Based on what you have shared with me it looks like you have Acute Otitis Media.  Acute Otitis Media is an infection of the middle or inner ear. This type of infection can cause redness, inflammation, and fluid buildup behind the tympanic membrane (ear drum).  The usual symptoms include: Earache/Pain Fever Upper respiratory symptoms Lack of energy/Fatigue/Malaise Slight hearing loss gradually worsening- if the inner ear fills with fluid What causes middle ear infections? Most middle ear infections occur when an infection such as a cold, leads to a build-up of mucus in the middle ear and causes the Eustachian tube (a thin tube that runs from the middle ear to the back of the nose) to become swollen or blocked.   This means mucus can't drain away properly, making it easier for an infection to spread into the middle ear.  How middle ear infections are treated: Most ear infections clear up within three to five days and don't need any specific treatment. If necessary, tylenol or ibuprofen should be used to relieve pain and a high temperature.  If you develop a fever higher than 102, or any significantly worsening symptoms, this could indicate a more serious infection moving to the middle/inner and needs face to face evaluation in an office by a provider.   Antibiotics aren't routinely used to treat middle ear infections, although they may occasionally be prescribed if symptoms persist or are particularly severe. Given your presentation,   I have prescribed Azithromycin  250 mg two tablets by mouth on day 1, then 1 tablet by mouth daily until completed  I have prescribed: Ciprofloxacin  0.3% and dexamethasone  0.1% otic suspension four drops in affected ears two times a day for 7 days.  I have also prescribed Flonase  nasal spray to use 2 sprays in each nostril once daily for 10-14  days for possible Eustachian Tube Dysfunction.    Your symptoms should improve over the next 3 days and should resolve in about 7 days. Be sure to complete ALL of the prescription(s) given.  HOME CARE: Wash your hands frequently. If you are prescribed an ear drop, do not place the tip of the bottle on your ear or touch it with your fingers. You can take Acetaminophen 650 mg every 4-6 hours as needed for pain.  If pain is severe or moderate, you can apply a heating pad (set on low) or hot water bottle (wrapped in a towel) to outer ear for 20 minutes.  This will also increase drainage.  GET HELP RIGHT AWAY IF: Fever is over 102.2 degrees. You develop progressive ear pain or hearing loss. Ear symptoms persist longer than 3 days after treatment.  MAKE SURE YOU: Understand these instructions. Will watch your condition. Will get help right away if you are not doing well or get worse.  Thank you for choosing an e-visit.  Your e-visit answers were reviewed by a board certified advanced clinical practitioner to complete your personal care plan. Depending upon the condition, your plan could have included both over the counter or prescription medications.  Please review your pharmacy choice. Make sure the pharmacy is open so you can pick up the prescription now. If there is a problem, you may contact your provider through [employer] and have the prescription routed to another pharmacy.  Your safety is important to us . If you have drug  allergies check your prescription carefully.   For the next 24 hours you can use MyChart to ask questions about today's visit, request a non-urgent call back, or ask for a work or school excuse. You will get an email with a survey after your eVisit asking about your experience. We would appreciate your feedback. I hope that your e-visit has been valuable and will aid in your recovery.    I have spent 5 minutes in review of e-visit questionnaire, review and  updating patient chart, medical decision making and response to patient.   Delon CHRISTELLA Dickinson, PA-C

## 2024-02-09 ENCOUNTER — Other Ambulatory Visit: Payer: Self-pay

## 2024-02-09 ENCOUNTER — Ambulatory Visit
Admission: RE | Admit: 2024-02-09 | Discharge: 2024-02-09 | Disposition: A | Discharge status: Home / Self Care | Source: Ambulatory Visit | Attending: Family Medicine | Admitting: Family Medicine

## 2024-02-09 ENCOUNTER — Ambulatory Visit

## 2024-02-09 VITALS — BP 127/85 | HR 84 | Temp 98.6°F | Resp 19

## 2024-02-09 DIAGNOSIS — J069 Acute upper respiratory infection, unspecified: Secondary | ICD-10-CM | POA: Diagnosis not present

## 2024-02-09 DIAGNOSIS — R059 Cough, unspecified: Secondary | ICD-10-CM

## 2024-02-09 MED ORDER — METHYLPREDNISOLONE SODIUM SUCC 125 MG IJ SOLR
125.0000 mg | Freq: Once | INTRAMUSCULAR | Status: AC
  Administered 2024-02-09: 125 mg via INTRAMUSCULAR

## 2024-02-09 MED ORDER — AZITHROMYCIN 250 MG PO TABS: 250.0000 mg | ORAL_TABLET | Freq: Every day | ORAL | 0 refills | Status: DC

## 2024-02-09 NOTE — Discharge Instructions (Addendum)
 Patient of chest x-ray results with hardcopy and image provided.  Advised patient take medication as directed with food to completion.  Encouraged to increase daily water intake to 64 ounces per day while taking this medication.  Advised if symptoms worsen and are unresolved please follow-up with your PCP or here for further evaluation.

## 2024-02-09 NOTE — ED Provider Notes (Signed)
 [facility]    CSN: [id number] Arrival date & time: 02/09/24  1123      History   Chief Complaint Chief Complaint  Patient presents with   Nasal Congestion    Have bad congestion, voice is gone, chest feels full, some cough. - Entered by patient    HPI Mariah Curtis is a 48 y.o. female.   HPI 48 year old female presents with sinus nasal congestion for 4 days.  PMH significant for interstitial cystitis and seasonal allergies.  Past Medical History:  Diagnosis Date   Interstitial cystitis    Seasonal allergies     There are no active problems to display for this patient.   History reviewed. No pertinent surgical history.  OB History   No obstetric history on file.      Home Medications    Prior to Admission medications   Medication Sig Start Date End Date Taking? Authorizing Provider  azithromycin  (ZITHROMAX ) 250 MG tablet Take 1 tablet (250 mg total) by mouth daily. Take first 2 tablets together, then 1 every day until finished. 02/09/24  Yes Teddy Sharper, FNP  cholecalciferol (VITAMIN D3) 25 MCG (1000 UNIT) tablet Take 1,000 Units by mouth daily.    [provider]  fexofenadine  (ALLEGRA  ALLERGY) 180 MG tablet Take 1 tablet (180 mg total) by mouth daily for 15 days. 04/03/23 04/18/23  Teddy Sharper, FNP  fluticasone  (FLONASE ) 50 MCG/ACT nasal spray Place 2 sprays into both nostrils daily. 12/13/23   Vivienne Delon HERO, PA-C    Family History Family History  Problem Relation Age of Onset   Heart attack Mother    Heart disease Mother    Heart failure Father    Heart disease Father     Social History Social History   Tobacco Use   Smoking status: Never   Smokeless tobacco: Never  Vaping Use   Vaping status: Never Used  Substance Use Topics   Alcohol use: No   Drug use: No     Allergies   Amoxicillin-pot clavulanate and Sulfamethoxazole -trimethoprim    Review of Systems Review of Systems   Physical Exam Triage Vital  Signs ED Triage Vitals  Encounter Vitals Group     BP      Girls Systolic BP Percentile      Girls Diastolic BP Percentile      Boys Systolic BP Percentile      Boys Diastolic BP Percentile      Pulse      Resp      Temp      Temp src      SpO2      Weight      Height      Head Circumference      Peak Flow      Pain Score      Pain Loc      Pain Education      Exclude from Growth Chart    No data found.  Updated Vital Signs BP 127/85   Pulse 84   Temp 98.6 F (37 C)   Resp 19   LMP 01/26/2024   SpO2 98%   Visual Acuity Right Eye Distance:   Left Eye Distance:   Bilateral Distance:    Right Eye Near:   Left Eye Near:    Bilateral Near:     Physical Exam Vitals and nursing note reviewed.  Constitutional:      Appearance: Normal appearance. She is normal weight. She is ill-appearing.  HENT:  Head: Normocephalic and atraumatic.     Right Ear: Tympanic membrane, ear canal and external ear normal.     Left Ear: Tympanic membrane, ear canal and external ear normal.     Mouth/Throat:     Mouth: Mucous membranes are moist.     Pharynx: Oropharynx is clear.  Eyes:     Extraocular Movements: Extraocular movements intact.     Conjunctiva/sclera: Conjunctivae normal.     Pupils: Pupils are equal, round, and reactive to light.  Cardiovascular:     Rate and Rhythm: Normal rate and regular rhythm.     Pulses: Normal pulses.     Heart sounds: Normal heart sounds.  Pulmonary:     Effort: Pulmonary effort is normal.     Breath sounds: No wheezing, rhonchi or rales.     Comments: Diminished breath sounds noted throughout, infrequent nonproductive cough on exam Musculoskeletal:        General: Normal range of motion.  Skin:    General: Skin is warm and dry.  Neurological:     General: No focal deficit present.     Mental Status: She is alert and oriented to person, place, and time. Mental status is at baseline.  Psychiatric:        Mood and Affect: Mood normal.         Behavior: Behavior normal.      UC Treatments / Results  Labs (all labs ordered are listed, but only abnormal results are displayed) Labs Reviewed - No data to display  EKG   Radiology DG Chest 2 View Result Date: 02/09/2024 CLINICAL DATA:  Cough and congestion for 1 week EXAM: CHEST - 2 VIEW COMPARISON:  03/04/2022 FINDINGS: The heart size and mediastinal contours are within normal limits. Both lungs are clear. The visualized skeletal structures are unremarkable. IMPRESSION: No active cardiopulmonary disease. Electronically Signed   By: Mariah Curtis M.D.   On: 02/09/2024 12:18    Procedures Procedures (including critical care time)  Medications Ordered in UC Medications  methylPREDNISolone  sodium succinate (SOLU-MEDROL ) 125 mg/2 mL injection 125 mg (125 mg Intramuscular Given 02/09/24 1233)    Initial Impression / Assessment and Plan / UC Course  I have reviewed the triage vital signs and the nursing notes.  Pertinent labs & imaging results that were available during my care of the patient were reviewed by me and considered in my medical decision making (see chart for details).     MDM: 1.  Acute URI-Rx'd Zithromax : Take as directed; 2.  Cough, unspecified type-IM Solu-Medrol  125 mg once in clinic and prior to discharge. Patient of chest x-ray results with hardcopy and image provided.  Advised patient take medication as directed with food to completion.  Encouraged to increase daily water intake to 64 ounces per day while taking this medication.  Advised if symptoms worsen and are unresolved please follow-up with your PCP or here for further evaluation.  Patient discharged home, hemodynamically stable.  Work note provided to patient prior to discharge. Final Clinical Impressions(s) / UC Diagnoses   Final diagnoses:  Cough, unspecified type  Acute URI     Discharge Instructions      Patient of chest x-ray results with hardcopy and image provided.  Advised patient  take medication as directed with food to completion.  Encouraged to increase daily water intake to 64 ounces per day while taking this medication.  Advised if symptoms worsen and are unresolved please follow-up with your PCP or here for further evaluation.  ED Prescriptions     Medication Sig Dispense Auth. Provider   azithromycin  (ZITHROMAX ) 250 MG tablet Take 1 tablet (250 mg total) by mouth daily. Take first 2 tablets together, then 1 every day until finished. 6 tablet Mariah Nifong, FNP      PDMP not reviewed this encounter.   Teddy Sharper, FNP 02/09/24 1258

## 2024-02-09 NOTE — ED Triage Notes (Signed)
 Pt presents to uc with hoarsness, low grade fever, sinus infection, chest congestion since 10/21. 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

## 2024-03-16 ENCOUNTER — Inpatient Hospital Stay: Admission: RE | Admit: 2024-03-16 | Discharge: 2024-03-16 | Attending: Family Medicine

## 2024-03-16 ENCOUNTER — Other Ambulatory Visit: Payer: Self-pay

## 2024-03-16 VITALS — BP 122/77 | HR 71 | Temp 99.3°F | Resp 18 | Ht 62.0 in | Wt 125.0 lb

## 2024-03-16 DIAGNOSIS — J0191 Acute recurrent sinusitis, unspecified: Secondary | ICD-10-CM

## 2024-03-16 MED ORDER — CEFDINIR 300 MG PO CAPS: 300.0000 mg | ORAL_CAPSULE | Freq: Two times a day (BID) | ORAL | 0 refills | Status: AC

## 2024-03-16 NOTE — Discharge Instructions (Signed)
 Continue with the Flonase  and Allegra  Drink lots of water Take cefdinir  2 times a day for 10 days Return as needed

## 2024-03-16 NOTE — ED Provider Notes (Signed)
 [facility]    CSN: [id number] Arrival date & time: 03/16/24  1153      History   Chief Complaint Chief Complaint  Patient presents with   Nasal Congestion    Possible sinus infection - Entered by patient    HPI Mariah Curtis is a 48 y.o. female.   Chart is reviewed.  Patient has underlying allergies.  She has chronic sinus infections.  She has had many over the last couple of years documented in the record.  She states that she currently has sinus congestion, postnasal drip, pressure and pain in her sinuses and slight cough.  Symptoms have been present for 2 weeks.  She states that she tries to fight the infections on her own and then comes in only if she fails to improve.    Past Medical History:  Diagnosis Date   Interstitial cystitis    Seasonal allergies     There are no active problems to display for this patient.   History reviewed. No pertinent surgical history.  OB History   No obstetric history on file.      Home Medications    Prior to Admission medications   Medication Sig Start Date End Date Taking? Authorizing Provider  cefdinir  (OMNICEF ) 300 MG capsule Take 1 capsule (300 mg total) by mouth 2 (two) times daily. 03/16/24  Yes Maranda Jamee Jacob, MD  cholecalciferol (VITAMIN D3) 25 MCG (1000 UNIT) tablet Take 1,000 Units by mouth daily.    [provider]  fexofenadine  (ALLEGRA  ALLERGY) 180 MG tablet Take 1 tablet (180 mg total) by mouth daily for 15 days. 04/03/23 04/18/23  Teddy Sharper, FNP  fluticasone  (FLONASE ) 50 MCG/ACT nasal spray Place 2 sprays into both nostrils daily. 12/13/23   Vivienne Delon HERO, PA-C    Family History Family History  Problem Relation Age of Onset   Heart attack Mother    Heart disease Mother    Heart failure Father    Heart disease Father     Social History Social History   Tobacco Use   Smoking status: Never   Smokeless tobacco: Never  Vaping Use   Vaping status: Never Used  Substance Use  Topics   Alcohol use: No   Drug use: No     Allergies   Amoxicillin-pot clavulanate and Sulfamethoxazole -trimethoprim    Review of Systems Review of Systems  See HPI Physical Exam Triage Vital Signs ED Triage Vitals  Encounter Vitals Group     BP 03/16/24 1200 122/77     Girls Systolic BP Percentile --      Girls Diastolic BP Percentile --      Boys Systolic BP Percentile --      Boys Diastolic BP Percentile --      Pulse Rate 03/16/24 1200 71     Resp 03/16/24 1200 18     Temp 03/16/24 1200 99.3 F (37.4 C)     Temp Source 03/16/24 1200 Oral     SpO2 03/16/24 1200 100 %     Weight 03/16/24 1203 125 lb (56.7 kg)     Height 03/16/24 1203 5' 2 (1.575 m)     Head Circumference --      Peak Flow --      Pain Score 03/16/24 1202 0     Pain Loc --      Pain Education --      Exclude from Growth Chart --    No data found.  Updated Vital Signs BP 122/77 (BP  Location: Right Arm)   Pulse 71   Temp 99.3 F (37.4 C) (Oral)   Resp 18   Ht 5' 2 (1.575 m)   Wt 56.7 kg   LMP 03/12/2024   SpO2 100%   BMI 22.86 kg/m      Physical Exam Constitutional:      General: She is not in acute distress.    Appearance: She is well-developed and normal weight.  HENT:     Head: Normocephalic and atraumatic.     Right Ear: Tympanic membrane normal.     Left Ear: Tympanic membrane normal.     Nose: Congestion and rhinorrhea present.     Comments: Nasal membrane swollen and red.  Postnasal drip is noted in the posterior pharynx.  Posterior pharynx is erythematous.  Facial sinuses are tender Eyes:     Conjunctiva/sclera: Conjunctivae normal.     Pupils: Pupils are equal, round, and reactive to light.  Cardiovascular:     Rate and Rhythm: Normal rate and regular rhythm.     Heart sounds: Normal heart sounds.  Pulmonary:     Effort: Pulmonary effort is normal. No respiratory distress.  Musculoskeletal:        General: Normal range of motion.     Cervical back: Normal range of  motion.  Skin:    General: Skin is warm and dry.  Neurological:     Mental Status: She is alert.      UC Treatments / Results  Labs (all labs ordered are listed, but only abnormal results are displayed) Labs Reviewed - No data to display  EKG   Radiology No results found.  Procedures Procedures (including critical care time)  Medications Ordered in UC Medications - No data to display  Initial Impression / Assessment and Plan / UC Course  I have reviewed the triage vital signs and the nursing notes.  Pertinent labs & imaging results that were available during my care of the patient were reviewed by me and considered in my medical decision making (see chart for details).     Final Clinical Impressions(s) / UC Diagnoses   Final diagnoses:  Acute recurrent sinusitis, unspecified location     Discharge Instructions      Continue with the Flonase  and Allegra  Drink lots of water Take cefdinir  2 times a day for 10 days Return as needed   ED Prescriptions     Medication Sig Dispense Auth. Provider   cefdinir  (OMNICEF ) 300 MG capsule Take 1 capsule (300 mg total) by mouth 2 (two) times daily. 20 capsule Maranda Jamee Jacob, MD      PDMP not reviewed this encounter.   Maranda Jamee Jacob, MD 03/16/24 1249

## 2024-03-16 NOTE — ED Triage Notes (Signed)
 Pt presenting with c/o nasal congestion and non productive cough x 2 weeks. Pt stated she has been using Allegra  and Flonase  , last used last night with minimal effectiveness.
